# Patient Record
Sex: Female | Born: 1960 | Hispanic: No | Marital: Married | Smoking: Never smoker
Health system: Southern US, Community
[De-identification: ages and names within clinical notes are randomized; demographics above are authoritative.]

## PROBLEM LIST (undated history)

## (undated) DIAGNOSIS — J45909 Unspecified asthma, uncomplicated: Secondary | ICD-10-CM

## (undated) DIAGNOSIS — D219 Benign neoplasm of connective and other soft tissue, unspecified: Secondary | ICD-10-CM

## (undated) DIAGNOSIS — K219 Gastro-esophageal reflux disease without esophagitis: Secondary | ICD-10-CM

## (undated) DIAGNOSIS — M1990 Unspecified osteoarthritis, unspecified site: Secondary | ICD-10-CM

## (undated) HISTORY — PX: MYOMECTOMY: SHX85

---

## 2016-06-21 ENCOUNTER — Encounter (HOSPITAL_COMMUNITY): Payer: Self-pay | Admitting: *Deleted

## 2016-06-21 ENCOUNTER — Inpatient Hospital Stay (HOSPITAL_COMMUNITY)
Admission: AD | Admit: 2016-06-21 | Discharge: 2016-06-21 | Disposition: A | Discharge status: Home / Self Care | Payer: Self-pay | Source: Ambulatory Visit | Attending: Obstetrics & Gynecology | Admitting: Obstetrics & Gynecology

## 2016-06-21 ENCOUNTER — Inpatient Hospital Stay (HOSPITAL_COMMUNITY): Payer: Self-pay

## 2016-06-21 DIAGNOSIS — Z3202 Encounter for pregnancy test, result negative: Secondary | ICD-10-CM | POA: Insufficient documentation

## 2016-06-21 DIAGNOSIS — R102 Pelvic and perineal pain: Secondary | ICD-10-CM | POA: Insufficient documentation

## 2016-06-21 DIAGNOSIS — N83202 Unspecified ovarian cyst, left side: Secondary | ICD-10-CM | POA: Insufficient documentation

## 2016-06-21 DIAGNOSIS — D259 Leiomyoma of uterus, unspecified: Secondary | ICD-10-CM | POA: Insufficient documentation

## 2016-06-21 HISTORY — DX: Benign neoplasm of connective and other soft tissue, unspecified: D21.9

## 2016-06-21 HISTORY — DX: Unspecified asthma, uncomplicated: J45.909

## 2016-06-21 LAB — URINALYSIS, ROUTINE W REFLEX MICROSCOPIC
Bilirubin Urine: NEGATIVE
GLUCOSE, UA: NEGATIVE mg/dL
HGB URINE DIPSTICK: NEGATIVE
KETONES UR: NEGATIVE mg/dL
Leukocytes, UA: NEGATIVE
Nitrite: NEGATIVE
PROTEIN: NEGATIVE mg/dL
Specific Gravity, Urine: 1.008 (ref 1.005–1.030)
pH: 6 (ref 5.0–8.0)

## 2016-06-21 LAB — CBC
HCT: 33.8 % — ABNORMAL LOW (ref 36.0–46.0)
HEMOGLOBIN: 11.2 g/dL — AB (ref 12.0–15.0)
MCH: 26 pg (ref 26.0–34.0)
MCHC: 33.1 g/dL (ref 30.0–36.0)
MCV: 78.6 fL (ref 78.0–100.0)
PLATELETS: 371 10*3/uL (ref 150–400)
RBC: 4.3 MIL/uL (ref 3.87–5.11)
RDW: 20.1 % — ABNORMAL HIGH (ref 11.5–15.5)
WBC: 8.8 10*3/uL (ref 4.0–10.5)

## 2016-06-21 LAB — WET PREP, GENITAL
Clue Cells Wet Prep HPF POC: NONE SEEN
SPERM: NONE SEEN
Trich, Wet Prep: NONE SEEN
YEAST WET PREP: NONE SEEN

## 2016-06-21 LAB — POCT PREGNANCY, URINE: Preg Test, Ur: NEGATIVE

## 2016-06-21 MED ORDER — OXYCODONE HCL 5 MG PO TABS
5.0000 mg | ORAL_TABLET | Freq: Once | ORAL | Status: AC
  Administered 2016-06-21: 5 mg via ORAL
  Filled 2016-06-21: qty 1

## 2016-06-21 MED ORDER — TRAMADOL HCL 50 MG PO TABS: 50.0000 mg | ORAL_TABLET | Freq: Four times a day (QID) | ORAL | 0 refills | Status: AC | PRN

## 2016-06-21 NOTE — MAU Provider Note (Signed)
History     CSN: [id number]  Arrival date and time: 06/21/16 1135   First Provider Initiated Contact with Patient 06/21/16 1213      Chief Complaint  Patient presents with  . Abdominal Pain   56 y.o. non-pregnant female here with LAP. Pain started 4 days ago. She describes as intermittent and worse on right side. Rates pain 8/10. Has not used anything for the pain. Denies fever or chills. No N/V/D/C. She is new to the country, arrived just 4 days ago from [place]. She reports hx of uterine fibroids, previous myomectomy, and anemia. Regular monthly cycles that are heavy.    Past Medical History:  Diagnosis Date  . Asthma   . Fibroid     Past Surgical History:  Procedure Laterality Date  . CESAREAN SECTION      History reviewed. No pertinent family history.  Social History  Substance Use Topics  . Smoking status: Never Smoker  . Smokeless tobacco: Never Used  . Alcohol use No    Allergies:  Allergies  Allergen Reactions  . Ibuprofen     Patient states triggers ashma    No prescriptions prior to admission.    Review of Systems  Gastrointestinal: Positive for abdominal pain.  Genitourinary: Positive for pelvic pain. Negative for dysuria, frequency, hematuria, urgency, vaginal bleeding and vaginal discharge.   Physical Exam   Blood pressure 158/88, pulse 80, temperature 98.2 F (36.8 C), temperature source Oral, resp. rate 18, last menstrual period 05/31/2016, SpO2 100 %.  Physical Exam  Constitutional: She is oriented to person, place, and time. She appears well-developed and well-nourished. No distress.  HENT:  Head: Normocephalic and atraumatic.  Neck: Normal range of motion.  Cardiovascular: Normal rate.   Respiratory: Effort normal.  GI: Soft. She exhibits mass (below umbilicus, ?fibroids). She exhibits no distension. There is tenderness in the right lower quadrant and left lower quadrant. There is no rebound and no guarding.  Genitourinary:   Genitourinary Comments: External: no lesions or erythema Vagina: rugated, parous, scant white thin discharge Uterus: ++ enlarged, anteverted, + tender, no CMT Adnexae: no masses, no tenderness left, + tenderness right   Musculoskeletal: Normal range of motion.  Neurological: She is alert and oriented to person, place, and time.  Skin: Skin is warm and dry.  Psychiatric: She has a normal mood and affect.   Results for orders placed or performed during the hospital encounter of 06/21/16 (from the past 24 hour(s))  Urinalysis, Routine w reflex microscopic     Status: Abnormal   Collection Time: 06/21/16 11:49 AM  Result Value Ref Range   Color, Urine STRAW (A) YELLOW   APPearance CLEAR CLEAR   Specific Gravity, Urine 1.008 1.005 - 1.030   pH 6.0 5.0 - 8.0   Glucose, UA NEGATIVE NEGATIVE mg/dL   Hgb urine dipstick NEGATIVE NEGATIVE   Bilirubin Urine NEGATIVE NEGATIVE   Ketones, ur NEGATIVE NEGATIVE mg/dL   Protein, ur NEGATIVE NEGATIVE mg/dL   Nitrite NEGATIVE NEGATIVE   Leukocytes, UA NEGATIVE NEGATIVE  Pregnancy, urine POC     Status: None   Collection Time: 06/21/16 12:18 PM  Result Value Ref Range   Preg Test, Ur NEGATIVE NEGATIVE  Wet prep, genital     Status: Abnormal   Collection Time: 06/21/16 12:40 PM  Result Value Ref Range   Yeast Wet Prep HPF POC NONE SEEN NONE SEEN   Trich, Wet Prep NONE SEEN NONE SEEN   Clue Cells Wet Prep HPF POC NONE SEEN  NONE SEEN   WBC, Wet Prep HPF POC FEW (A) NONE SEEN   Sperm NONE SEEN   CBC     Status: Abnormal   Collection Time: 06/21/16 12:49 PM  Result Value Ref Range   WBC 8.8 4.0 - 10.5 K/uL   RBC 4.30 3.87 - 5.11 MIL/uL   Hemoglobin 11.2 (L) 12.0 - 15.0 g/dL   HCT 33.8 (L) 36.0 - 46.0 %   MCV 78.6 78.0 - 100.0 fL   MCH 26.0 26.0 - 34.0 pg   MCHC 33.1 30.0 - 36.0 g/dL   RDW 20.1 (H) 11.5 - 15.5 %   Platelets 371 150 - 400 K/uL   US Transvaginal Non-ob  Result Date: 06/21/2016 CLINICAL DATA:  Pelvic pain, cramping EXAM:  TRANSABDOMINAL AND TRANSVAGINAL ULTRASOUND OF PELVIS DOPPLER ULTRASOUND OF OVARIES TECHNIQUE: Both transabdominal and transvaginal ultrasound examinations of the pelvis were performed. Transabdominal technique was performed for global imaging of the pelvis including uterus, ovaries, adnexal regions, and pelvic cul-de-sac. It was necessary to proceed with endovaginal exam following the transabdominal exam to visualize the endometrium and ovaries. Color and duplex Doppler ultrasound was utilized to evaluate blood flow to the ovaries. COMPARISON:  None. FINDINGS: Uterus Measurements: 12.4 x 1.6 x 9.3 cm. Multiple hypoechoic uterine masses. Three anterior masses measuring 1.5 x 1.2 x 1.1 cm (submucosal), 3.6 x 3.9 x 4.2 cm (submucosal) and 2.5 x 4 x 3.5 cm (subserosal) respectively. Posterior uterine mass in the lower uterine segment measuring 3.5 x 3.2 x 2.7 cm (submucosal). Masses are most consistent with multiple fibroids. Endometrium Thickness: Tubercle to fully visualize. No focal abnormality visualized. Right ovary Not visualized. Left ovary Measurements: 4.1 x 3.6 x 3.3 cm. 3 x 2.1 x 2.2 cm hypoechoic, avascular left ovarian mass most consistent with a complex cyst. Pulsed Doppler evaluation of the left ovary demonstrates normal low-resistance arterial and venous waveforms. Other findings No abnormal free fluid. IMPRESSION: 1. Fibroid uterus. 2. Complex left ovarian cyst which may reflect a hemorrhagic cyst or endometrioma. 3. No left ovarian torsion. 4. Nonvisualized right ovary. Electronically Signed   By: Kathreen Devoid   On: 06/21/2016 13:55   US Pelvis Complete  Result Date: 06/21/2016 CLINICAL DATA:  Pelvic pain, cramping EXAM: TRANSABDOMINAL AND TRANSVAGINAL ULTRASOUND OF PELVIS DOPPLER ULTRASOUND OF OVARIES TECHNIQUE: Both transabdominal and transvaginal ultrasound examinations of the pelvis were performed. Transabdominal technique was performed for global imaging of the pelvis including uterus,  ovaries, adnexal regions, and pelvic cul-de-sac. It was necessary to proceed with endovaginal exam following the transabdominal exam to visualize the endometrium and ovaries. Color and duplex Doppler ultrasound was utilized to evaluate blood flow to the ovaries. COMPARISON:  None. FINDINGS: Uterus Measurements: 12.4 x 1.6 x 9.3 cm. Multiple hypoechoic uterine masses. Three anterior masses measuring 1.5 x 1.2 x 1.1 cm (submucosal), 3.6 x 3.9 x 4.2 cm (submucosal) and 2.5 x 4 x 3.5 cm (subserosal) respectively. Posterior uterine mass in the lower uterine segment measuring 3.5 x 3.2 x 2.7 cm (submucosal). Masses are most consistent with multiple fibroids. Endometrium Thickness: Tubercle to fully visualize. No focal abnormality visualized. Right ovary Not visualized. Left ovary Measurements: 4.1 x 3.6 x 3.3 cm. 3 x 2.1 x 2.2 cm hypoechoic, avascular left ovarian mass most consistent with a complex cyst. Pulsed Doppler evaluation of the left ovary demonstrates normal low-resistance arterial and venous waveforms. Other findings No abnormal free fluid. IMPRESSION: 1. Fibroid uterus. 2. Complex left ovarian cyst which may reflect a hemorrhagic cyst or endometrioma.  3. No left ovarian torsion. 4. Nonvisualized right ovary. Electronically Signed   By: Kathreen Devoid   On: 06/21/2016 13:55   [place] Art/ven Flow Abd Pelv Doppler  Result Date: 06/21/2016 CLINICAL DATA:  Pelvic pain, cramping EXAM: TRANSABDOMINAL AND TRANSVAGINAL ULTRASOUND OF PELVIS DOPPLER ULTRASOUND OF OVARIES TECHNIQUE: Both transabdominal and transvaginal ultrasound examinations of the pelvis were performed. Transabdominal technique was performed for global imaging of the pelvis including uterus, ovaries, adnexal regions, and pelvic cul-de-sac. It was necessary to proceed with endovaginal exam following the transabdominal exam to visualize the endometrium and ovaries. Color and duplex Doppler ultrasound was utilized to evaluate blood flow to the ovaries.  COMPARISON:  None. FINDINGS: Uterus Measurements: 12.4 x 1.6 x 9.3 cm. Multiple hypoechoic uterine masses. Three anterior masses measuring 1.5 x 1.2 x 1.1 cm (submucosal), 3.6 x 3.9 x 4.2 cm (submucosal) and 2.5 x 4 x 3.5 cm (subserosal) respectively. Posterior uterine mass in the lower uterine segment measuring 3.5 x 3.2 x 2.7 cm (submucosal). Masses are most consistent with multiple fibroids. Endometrium Thickness: Tubercle to fully visualize. No focal abnormality visualized. Right ovary Not visualized. Left ovary Measurements: 4.1 x 3.6 x 3.3 cm. 3 x 2.1 x 2.2 cm hypoechoic, avascular left ovarian mass most consistent with a complex cyst. Pulsed Doppler evaluation of the left ovary demonstrates normal low-resistance arterial and venous waveforms. Other findings No abnormal free fluid. IMPRESSION: 1. Fibroid uterus. 2. Complex left ovarian cyst which may reflect a hemorrhagic cyst or endometrioma. 3. No left ovarian torsion. 4. Nonvisualized right ovary. Electronically Signed   By: Kathreen Devoid   On: 06/21/2016 13:55   MAU Course  Procedures Oxycodone 5 mg po  MDM Labs and [place] ordered and reviewed. Some pain relief after meds. No evidence of acute abdominal process. Pain likely r/t uterine fibroids and cyst. Stable for discharge home.  Assessment and Plan   1. Uterine leiomyoma, unspecified location   2. Pelvic pain   3. Left ovarian cyst    Discharge home Follow up in [place] next available appt Rx Ultram  Allergies as of 06/21/2016      Reactions   Ibuprofen    Patient states triggers ashma      Medication List    TAKE these medications   traMADol 50 MG tablet Commonly known as:  ULTRAM Take 1 tablet (50 mg total) by mouth every 6 (six) hours as needed.      Julianne Handler, CNM 06/21/2016, 12:31 PM

## 2016-06-21 NOTE — MAU Note (Signed)
Patient presents to mau with c/o lower abdominal pain that started [place] on [place]. Cramping in nature. Denies vaginal bleeding.

## 2016-06-21 NOTE — Discharge Instructions (Signed)
Uterine Fibroids Uterine fibroids are tissue masses (tumors). They are also called leiomyomas. They can develop inside of a womans womb (uterus). They can grow very large. Fibroids are not cancerous (benign). Most fibroids do not require medical treatment. Follow these instructions at home:  Keep all follow-up visits as told by your doctor. This is important.  Take medicines only as told by your doctor.  If you were prescribed a hormone treatment, take the hormone medicines exactly as told.  Do not take aspirin. It can cause bleeding.  Ask your doctor about taking iron pills and increasing the amount of dark green, leafy vegetables in your diet. These actions can help to boost your blood iron levels.  Pay close attention to your period. Tell your doctor about any changes, such as:  Increased blood flow. This may require you to use more pads or tampons than usual per month.  A change in the number of days that your period lasts per month.  A change in symptoms that come with your period, such as back pain or cramping in your belly area (abdomen). Contact a doctor if:  You have pain in your back or the area between your hip bones (pelvic area) that is not controlled by medicines.  You have pain in your abdomen that is not controlled with medicines.  You have an increase in bleeding between and during periods.  You soak tampons or pads in a half hour or less.  You feel lightheaded.  You feel extra tired.  You feel weak. Get help right away if:  You pass out (faint).  You have a sudden increase in pelvic pain. This information is not intended to replace advice given to you by your health care provider. Make sure you discuss any questions you have with your health care provider. Document Released: 05/02/2010 Document Revised: 11/29/2015 Document Reviewed: 09/26/2013 Elsevier Interactive Patient Education  2017 [employer].

## 2016-06-22 LAB — GC/CHLAMYDIA PROBE AMP (~~LOC~~) NOT AT ARMC
CHLAMYDIA, DNA PROBE: NEGATIVE
Neisseria Gonorrhea: NEGATIVE

## 2016-06-24 ENCOUNTER — Other Ambulatory Visit: Payer: Self-pay | Admitting: Obstetrics and Gynecology

## 2016-06-24 ENCOUNTER — Other Ambulatory Visit (HOSPITAL_COMMUNITY)
Admission: RE | Admit: 2016-06-24 | Discharge: 2016-06-24 | Disposition: A | Discharge status: Home / Self Care | Payer: Self-pay | Source: Ambulatory Visit | Attending: Obstetrics and Gynecology | Admitting: Obstetrics and Gynecology

## 2016-06-24 DIAGNOSIS — Z1151 Encounter for screening for human papillomavirus (HPV): Secondary | ICD-10-CM | POA: Insufficient documentation

## 2016-06-24 DIAGNOSIS — Z01419 Encounter for gynecological examination (general) (routine) without abnormal findings: Secondary | ICD-10-CM | POA: Insufficient documentation

## 2016-06-25 LAB — CYTOLOGY - PAP
Adequacy: ABSENT
Diagnosis: NEGATIVE
HPV: NOT DETECTED

## 2016-06-29 ENCOUNTER — Ambulatory Visit (INDEPENDENT_AMBULATORY_CARE_PROVIDER_SITE_OTHER): Payer: Self-pay | Admitting: Family Medicine

## 2016-06-29 VITALS — BP 128/84 | HR 79 | Temp 99.0°F | Resp 16 | Ht 62.0 in | Wt 240.8 lb

## 2016-06-29 DIAGNOSIS — G9332 Myalgic encephalomyelitis/chronic fatigue syndrome: Secondary | ICD-10-CM

## 2016-06-29 DIAGNOSIS — J4540 Moderate persistent asthma, uncomplicated: Secondary | ICD-10-CM

## 2016-06-29 DIAGNOSIS — M170 Bilateral primary osteoarthritis of knee: Secondary | ICD-10-CM

## 2016-06-29 DIAGNOSIS — D250 Submucous leiomyoma of uterus: Secondary | ICD-10-CM

## 2016-06-29 DIAGNOSIS — R5382 Chronic fatigue, unspecified: Secondary | ICD-10-CM

## 2016-06-29 NOTE — Patient Instructions (Addendum)
We recommend that you schedule a mammogram for breast cancer screening. Typically, you do not need a referral to do this. Please contact a local imaging center to schedule your mammogram.  [facility] - [phone]  *ask for the Radiology Department [facility] ([place]) - [phone] or [phone]  [facility] - [phone] [place] [phone] [facility] - [phone]  *ask for the [facility] - [phone]  *ask for the Radiology Department [facility] - [phone]  *ask for the [place] - [phone]     IF you received an x-ray today, you will receive an invoice from [facility] Rad[facility]. Please contact [facility] at [phone] with questions or concerns regarding your invoice.   IF you received labwork today, you will receive an invoice from [facility]. Please contact [facility] at [phone] with questions or concerns regarding your invoice.   Our billing staff will not be able to assist you with questions regarding bills from these companies.  You will be contacted with the lab results as soon as they are available. The fastest way to get your results is to activate your My Chart account. Instructions are located on the last page of this paperwork. If you have not heard from [place] regarding the results in 2 weeks, please contact this office.      Arthritis Arthritis means joint pain. It can also mean joint disease. A joint is a place where bones come together. People who have arthritis may have:  Red joints.  Swollen joints.  Stiff joints.  Warm joints.  A fever.  A feeling of being sick. Follow these instructions at home: Pay attention to any changes in your symptoms. Take these actions to help with your pain and swelling. Medicines   Take over-the-counter and prescription medicines only as  told by your doctor.  Do not take aspirin for pain if your doctor says that you may have gout. Activity   Rest your joint if your doctor tells you to.  Avoid activities that make the pain worse.  Exercise your joint regularly as told by your doctor. Try doing exercises like:  Swimming.  Water aerobics.  Biking.  Walking. Joint Care    If your joint is swollen, keep it raised (elevated) if told by your doctor.  If your joint feels stiff in the morning, try taking a warm shower.  If you have diabetes, do not apply heat without asking your doctor.  If told, apply heat to the joint:  Put a towel between the joint and the hot pack or heating pad.  Leave the heat on the area for 20-30 minutes.  If told, apply ice to the joint:  Put ice in a plastic bag.  Place a towel between your skin and the bag.  Leave the ice on for 20 minutes, 2-3 times per day.  Keep all follow-up visits as told by your doctor. Contact a doctor if:  The pain gets worse.  You have a fever. Get help right away if:  You have very bad pain in your joint.  You have swelling in your joint.  Your joint is red.  Many joints become painful and swollen.  You have very bad back pain.  Your leg is very weak.  You cannot control your pee (urine) or poop (stool). This information is not intended to replace advice given to you by your health care provider.  Make sure you discuss any questions you have with your health care provider. Document Released: 06/24/2009 Document Revised: 09/05/2015 Document Reviewed: 06/25/2014 Elsevier Interactive Patient Education  2017 [employer].

## 2016-06-29 NOTE — Progress Notes (Signed)
Chief Complaint  Patient presents with  . Asthma    takes clestene it makes her itching     HPI ID # [id number] Patient is visiting the from [place] with her sister.   She is here for asthma exacerbation, knee pain and fatigue.   Pt reports that she has been diagnosed with asthma for 7 years She reports that this medication is making her feel more fatigue She reports that her asthma has been She reports waking up at night choking In the daytime she is using her ventolin   Last asthma exacerbation a few weeks ago.  She reports that she has been having more fatigue. She has a history of severe anemia and will be getting a hysterectomy due to her recent diagnosis  She is on a french version of beclomethasone and gets itchy hands and feet She is also on albuterol.  Fatigue and Chronic Uterine Fibroid She also has uterine fibroids and is scheduled for surgery on July 08, 2016 She reports that she did not know that when she visited this would be a problem.  She has a history of fatigue and anemia for years.  Bilateral Knee Pain Patient reports that she has been having long history of lower extremity pain and edema specifically in her knees bilaterally with some crunchy feeling and swelling. She denies calf pain and denies   Past Medical History:  Diagnosis Date  . Arthritis    both knees  . Asthma   . Fibroid   . GERD (gastroesophageal reflux disease)     Current Outpatient Prescriptions  Medication Sig Dispense Refill  . albuterol (PROVENTIL HFA;VENTOLIN HFA) 108 (90 Base) MCG/ACT inhaler Inhale 1-2 puffs into the lungs every 6 (six) hours as needed for wheezing or shortness of breath.    . traMADol (ULTRAM) 50 MG tablet Take 1 tablet (50 mg total) by mouth every 6 (six) hours as needed. (Patient taking differently: Take 50 mg by mouth every 6 (six) hours as needed (for pain.). ) 30 tablet 0  . acetaminophen (TYLENOL 8 HOUR ARTHRITIS PAIN) 650 MG CR tablet Take 650 mg by  mouth every 8 (eight) hours as needed for pain.    Marland Kitchen BETAMETHASONE PO Take 2 mg by mouth every evening. Celestene 2mg     . ferrous sulfate 325 (65 FE) MG tablet Take 325 mg by mouth 2 (two) times daily.    . traMADol (ULTRAM) 50 MG tablet Take 1 tablet (50 mg total) by mouth every 6 (six) hours as needed for moderate pain. 30 tablet 0   No current facility-administered medications for this visit.     Allergies:  Allergies  Allergen Reactions  . Ibuprofen Other (See Comments)    Pt states that this medication triggers her asthma.    . Shrimp [Shellfish Allergy] Swelling    Of tongue and itching    Past Surgical History:  Procedure Laterality Date  . CESAREAN SECTION    . HYSTERECTOMY ABDOMINAL WITH SALPINGECTOMY Bilateral 07/08/2016   Procedure: HYSTERECTOMY ABDOMINAL WITH SALPINGECTOMY;  Surgeon: Thurnell Lose, MD;  Location: [facility];  Service: Gynecology;  Laterality: Bilateral;  . LYSIS OF ADHESION N/A 07/08/2016   Procedure: LYSIS OF ADHESION;  Surgeon: Thurnell Lose, MD;  Location: [facility];  Service: Gynecology;  Laterality: N/A;  . MYOMECTOMY      Social History   Social History  . Marital status: Married    Spouse name: N/A  . Number of children: N/A  . Years of education:  N/A   Social History Main Topics  . Smoking status: Never Smoker  . Smokeless tobacco: Never Used  . Alcohol use No  . Drug use: No  . Sexual activity: Not Asked   Other Topics Concern  . None   Social History Narrative  . None    Review of Systems  Constitutional: Positive for malaise/fatigue. Negative for chills, diaphoresis and fever.  HENT: Negative for congestion, ear discharge and nosebleeds.   Eyes: Negative for blurred vision and double vision.  Respiratory: Positive for cough, shortness of breath and wheezing.   Cardiovascular: Negative for chest pain, palpitations and leg swelling.  Gastrointestinal: Negative for blood in stool, melena, nausea and vomiting.  Musculoskeletal:  Positive for joint pain. Negative for back pain, falls, myalgias and neck pain.  Skin: Negative for itching and rash.  Neurological: Negative for dizziness, tingling, tremors and headaches.  Psychiatric/Behavioral: Negative for depression. The patient is not nervous/anxious and does not have insomnia.    See hpi  Objective: Vitals:   06/29/16 1137  BP: 128/84  Pulse: 79  Resp: 16  Temp: 99 F (37.2 C)  TempSrc: Oral  SpO2: 97%  Weight: 240 lb 12.8 oz (109.2 kg)  Height: 5\' 2"  (1.575 m)    Physical Exam  Constitutional: She is oriented to person, place, and time. She appears well-developed and well-nourished.  HENT:  Head: Normocephalic and atraumatic.  Eyes: Conjunctivae and EOM are normal.  Neck: Normal range of motion. No thyromegaly present.  Cardiovascular: Normal rate, regular rhythm, normal heart sounds and intact distal pulses.   No murmur heard. Pulmonary/Chest: Effort normal. No respiratory distress. She has wheezes. She has no rales. She exhibits no tenderness.  Musculoskeletal:       Right knee: She exhibits swelling. She exhibits normal range of motion, no effusion, no ecchymosis, no deformity, no laceration, no erythema, normal alignment, no LCL laxity, no bony tenderness, normal meniscus and no MCL laxity.       Left knee: She exhibits swelling. She exhibits normal range of motion, no effusion, no ecchymosis, no deformity, no laceration, no erythema, normal alignment, no bony tenderness and normal meniscus.  Bilateral crepitus  Neurological: She is alert and oriented to person, place, and time. No cranial nerve deficit. Coordination normal.    Assessment and Plan Abigail Gibson was seen today for asthma.  Diagnoses and all orders for this visit:  Moderate persistent asthma without complication-  Patient does not want to change medications at this time She does not have insurance and plans to return to her provider in [place] Discussed reasons for urgent and  emergent follow up Patient assumes this risks and declines management changes today Discussed that I would give her a steroid burst high dose if she has progressive symptoms  Primary osteoarthritis of both knees- currently taking NSAIDs She was also advised to use topical meds like Aspercreme with lidocaine  Submucous uterine fibroid Fatigue - continue with Gynecology as discussed  Discussed postop management and that her fatigue might not resolved Discussed ways to improve iron intake in her diet  A total of 30 minutes were spent face-to-face with the patient during this encounter and over half of that time was spent on counseling and coordination of care.    [place]

## 2016-07-01 NOTE — Patient Instructions (Signed)
Your procedure is scheduled on:  Wednesday, July 08, 2016  Enter through the [employer] [facility] at:  7:15 AM  Pick up the phone at the desk and dial [phone].  Call this number if you have problems the morning of surgery: [phone].  Remember: Do NOT eat food or drink after:  Midnight Tuesday  Take these medicines the morning of surgery with a SIP OF WATER:  None  Bring Asthma Inhaler day of surgery  Stop ALL herbal medications at this time  Do NOT smoke the day of surgery.  Do NOT wear jewelry (body piercing), metal hair clips/bobby pins, make-up, or nail polish. Do NOT wear lotions, powders, or perfumes.  You may wear deodorant. Do NOT shave for 48 hours prior to surgery. Do NOT bring valuables to the hospital. Contacts, dentures, or bridgework may not be worn into surgery.  Leave suitcase in car.  After surgery it may be brought to your room.  For patients admitted to the hospital, checkout time is 11:00 AM the day of discharge.  Bring a copy of your healthcare power of attorney and living will documents.

## 2016-07-03 ENCOUNTER — Encounter (HOSPITAL_COMMUNITY): Payer: Self-pay

## 2016-07-03 ENCOUNTER — Encounter (HOSPITAL_COMMUNITY)
Admission: RE | Admit: 2016-07-03 | Discharge: 2016-07-03 | Disposition: A | Discharge status: Home / Self Care | Payer: Self-pay | Source: Ambulatory Visit | Attending: Obstetrics and Gynecology | Admitting: Obstetrics and Gynecology

## 2016-07-03 DIAGNOSIS — Z01818 Encounter for other preprocedural examination: Secondary | ICD-10-CM | POA: Insufficient documentation

## 2016-07-03 HISTORY — DX: Gastro-esophageal reflux disease without esophagitis: K21.9

## 2016-07-03 HISTORY — DX: Unspecified osteoarthritis, unspecified site: M19.90

## 2016-07-03 LAB — CBC
HCT: 34 % — ABNORMAL LOW (ref 36.0–46.0)
Hemoglobin: 10.9 g/dL — ABNORMAL LOW (ref 12.0–15.0)
MCH: 25.1 pg — ABNORMAL LOW (ref 26.0–34.0)
MCHC: 32.1 g/dL (ref 30.0–36.0)
MCV: 78.2 fL (ref 78.0–100.0)
PLATELETS: 552 10*3/uL — AB (ref 150–400)
RBC: 4.35 MIL/uL (ref 3.87–5.11)
RDW: 18.8 % — ABNORMAL HIGH (ref 11.5–15.5)
WBC: 5.9 10*3/uL (ref 4.0–10.5)

## 2016-07-03 LAB — TYPE AND SCREEN
ABO/RH(D): O POS
ANTIBODY SCREEN: NEGATIVE

## 2016-07-03 LAB — ABO/RH: ABO/RH(D): O POS

## 2016-07-03 NOTE — Pre-Procedure Instructions (Signed)
Used Associate Professor ID # [id number]

## 2016-07-06 MED ORDER — LIDOCAINE HCL 1 % IJ SOLN
INTRAMUSCULAR | Status: AC
  Filled 2016-07-06: qty 20

## 2016-07-06 MED ORDER — SODIUM BICARBONATE 8.4 % IV SOLN
INTRAVENOUS | Status: AC
  Filled 2016-07-06: qty 50

## 2016-07-06 MED ORDER — LIDOCAINE-EPINEPHRINE (PF) 2 %-1:200000 IJ SOLN
INTRAMUSCULAR | Status: AC
  Filled 2016-07-06: qty 20

## 2016-07-07 ENCOUNTER — Encounter: Payer: Self-pay | Admitting: Family Medicine

## 2016-07-07 NOTE — H&P (Signed)
Chief Complaint(s):   New/abnormal pap from [place]/     records are in french for abnormal pap, ER visit recently for pain, u/s done and shows fibroids and ovarian cyst   HPI:  General Pt was seen in [place] shortly after arriving in [place] to visit her brother. Sudden onset of pain. Pt has heavy menses that lasts 7 days. 2 days of very heavy. Changes tampons/pads every hour. After that they lighten up. Pt feels cold. Denies fatigue. Bled twice in 1 month 3 months ago. Pt was diagnosed in [place], [place], 3 years ago. Was told to wait menopause. Pt presents for an annual gyn exam.  Current Medication:  Taking  Tramadol HCl 50 MG Tablet 1 tablet as needed Orally every 6 hrs     Medication List reviewed and reconciled with the patient   Medical History:   No Medical History.      Allergies/Intolerance:   Ibuprofen - triggers asmtha   Gyn History:   Sexual activity currently sexually active.  Periods : every month.  LMP 05/25/16, heavy bleeding.  Denies Birth control.  Last mammogram date 3 years ago.   OB History:   Number of pregnancies 3.  Pregnancy # 1 live birth, vaginal delivery.  Pregnancy # 2 live birth, vaginal delivery.  Pregnancy # 3 live birth, C-section.   Surgical History:   Fibroids removed 2002     C-section 2004   Hospitalization:   see surgery   Family History:   Father: deceased, diagnosed with Hypertension    Mother: alive, asmtha    Brother 1: deceased, diagnosed with Colon cancer   denies any GYN family cancer hx.  Social History:  General Tobacco use  cigarettes: Never smoked  Tobacco history last updated 06/24/2016  no Alcohol.  no Recreational drug use.  Marital Status: married.  Children: Boys, 1, girls, 2.  ROS: CONSTITUTIONAL none" options="no,yes" propid="91" itemid="[id number]" categoryid="10464" encounterid="[id number]"Fatigue none. none today" options="no,yes" propid="91" itemid="10467" categoryid="10464"  encounterid="[id number]"Fever none today.  CARDIOLOGY none" options="no,yes" propid="91" itemid="193603" categoryid="10488" encounterid="[id number]"Chest pain none. No h/o mitral valve prolapse" options="no,yes" propid="91" itemid="194827" categoryid="10488" encounterid="[id number]"Murmurs No h/o mitral valve prolapse.  RESPIRATORY no" options="no" propid="91" itemid="270013" categoryid="138132" encounterid="[id number]"Shortness of breath no. no" options="no,yes" propid="91" itemid="172745" categoryid="138132" encounterid="[id number]"Cough no.  GASTROENTEROLOGY none" options="no,yes" propid="91" itemid="10496" categoryid="10494" encounterid="[id number]"Abdominal pain none. no" options="no,yes" propid="91" itemid="193449" categoryid="10494" encounterid="[id number]"Change in bowel habits no. No" options="no,yes" propid="91" itemid="10501" categoryid="10494" encounterid="[id number]"Constipation No. No gallbladder problems" options="" propid="91" itemid="282558" categoryid="10494" encounterid="[id number]"Gallstones No gallbladder problems. No h/o liver problems" options="" propid="91" itemid="282559" categoryid="10494" encounterid="[id number]"Hepatitis/yellow jaundice No h/o liver problems.  FEMALE REPRODUCTIVE no" options="no,yes" propid="91" itemid="196298" categoryid="10525" encounterid="[id number]"Breast lumps or discharge no. none" options="no,yes" propid="91" itemid="186083" categoryid="10525" encounterid="[id number]"Breast pain none. no" options="no,yes" propid="91" itemid="138235" categoryid="10525" encounterid="[id number]"Dysmenorrhea no. none" options="no,yes" propid="91" itemid="138198" categoryid="10525" encounterid="[id number]"Dyspareunia none. no" options="no,yes" propid="91" itemid="202654" categoryid="10525" encounterid="[id number]"Dysuria no. no " options="no,yes" propid="91" itemid="193444" categoryid="10525" encounterid="[id number]"Irregular menses no . none" options="no,yes" propid="91" itemid="186082" categoryid="10525"  encounterid="[id number]"Pelvic pain none. no" options="no,yes" propid="91" itemid="278230" categoryid="10525" encounterid="[id number]"Unusual vaginal discharge no. no" options="no,yes" propid="91" itemid="278942" categoryid="10525" encounterid="[id number]"Vaginal itching no.  NEUROLOGY none" options="no,yes" propid="91" itemid="193627" categoryid="12512" encounterid="[id number]"Migraines none. No" options="no,yes" propid="91" itemid="12515" categoryid="12512" encounterid="[id number]"Seizures No. none" options="no,yes" propid="91" itemid="12514" categoryid="12512" encounterid="[id number]"Tingling/numbness none.  PSYCHOLOGY no" options="" propid="91" itemid="275919" categoryid="10520" encounterid="[id number]"Depression no.  SKIN no" options="no,yes" propid="91" itemid="269383" categoryid="202750" encounterid="[id number]"Rash no. no" options="no,yes" propid="91" itemid="202757" categoryid="202750" encounterid="[id number]"Suspicious lesions no.  ENDOCRINOLOGY none" options="no,yes" propid="91" itemid="202624" categoryid="12508" encounterid="[id number]"Hot flashes none. none" options="no,yes" propid="91" itemid="193436" categoryid="12508" encounterid="[id number]"Weight gain none. none" options="no,yes" propid="91" itemid="138164" categoryid="12508" encounterid="[id number]"Weight loss none.  HEMATOLOGY/LYMPH no" options="no,yes" propid="91" itemid="193454" categoryid="138157" encounterid="[id number]"Anemia no. No  h/o blood clots" options="" propid="91" itemid="288987" categoryid="138157" encounterid="[id number]"Blood Clots No h/o blood clots.  DERMATOLOGY none" options="no,yes" propid="91" itemid="186079" categoryid="12503" encounterid="[id number]"Acne none.    Objective: Vitals:  Wt 242, Ht 62.5, BMI 43.55, Pulse sitting 88, BP sitting 136/88  Past Results:  Examination:  Physical Examination: GENERAL in NAD, pleasant"Patient appears in NAD, pleasant.  well developed"Build: well developed.  well-appearing"General Appearance: well-appearing.    African"Race: African.  NECK normal"ROM: normal.  no thyromegaly, non tender"Thyroid: no thyromegaly, non tender.  BREASTS no palpable masses, no lymphadenopathy, nontender"Axilla: no palpable masses, no lymphadenopathy, nontender.  no masses, dimpling, retraction, or nipple discharge , bilaterally"Breast Mass: no masses, dimpling, retraction, or nipple discharge , bilaterally.  unremarkable"Skin Change: unremarkable.  ABDOMEN no masses,tenderness,fibroid uterus palpated up to umbilicus, , obese"General: no masses,tenderness,fibroid uterus palpated up to umbilicus, , obese.  FEMALE GENITOURINARY no mass, non tender"Adnexa: no mass, non tender.  normal, no lesions"Anus/perineum: normal, no lesions.  normal appearance , no lesions/discharge/bleeding, good pelvic support "Cervix/ cuff: normal appearance , no lesions/discharge/bleeding, good pelvic support .  normal, no lesions, no skin discoloration"External genitalia: normal, no lesions, no skin discoloration.  deferred"Rectum: deferred.  normal external meatus"Urethra: normal external meatus.  irregularly enlarged, freely mobile, 20 weeks"Uterus: irregularly enlarged, freely mobile, 20 weeks.  pink/moist mucosa, no lesions, no abnormal discharge, odorless"Vagina: pink/moist mucosa, no lesions, no abnormal discharge, odorless.  normal, no lesions, no skin discoloration"Vulva: normal, no lesions, no skin discoloration.  EXTREMITIES no clubbing cyanosis or edema present"Extremities no clubbing cyanosis or edema present.  NEUROLOGICAL grossly intact"gross motor and sensory grossly intact.  alert and oriented x 3"Orientation: alert and oriented x 3.    Assessment: Assessment:  Encounter for gynecological examination (general) (routine) with abnormal findings - Z01.411 (Primary)     Complex cyst of left ovary - T51.761     Uterine leiomyoma, unspecified location - D25.9     Plan: Treatment:  Uterine leiomyoma, unspecified location   Notes: Pelvic ultrasound. Pt desires hysterectomy before she goes back to [place]. Discussed different types of hysterectomies. Pt informed she could be referred to [facility] for robotic hyst but would not be in time for her to return home. Pt would like to proceed with TAH. Risk discussed.  Others  Referral YW:[facility]  Reason:Precert TAH/BS on 2/69 if possible. Pt needs pelvic ultrasound and CA-125 prior to surgery.

## 2016-07-08 ENCOUNTER — Encounter (HOSPITAL_COMMUNITY): Payer: Self-pay

## 2016-07-08 ENCOUNTER — Encounter (HOSPITAL_COMMUNITY)
Admission: RE | Disposition: A | Discharge status: Home / Self Care | Payer: Self-pay | Source: Ambulatory Visit | Attending: Obstetrics and Gynecology

## 2016-07-08 ENCOUNTER — Inpatient Hospital Stay (HOSPITAL_COMMUNITY): Payer: Self-pay | Admitting: Anesthesiology

## 2016-07-08 ENCOUNTER — Inpatient Hospital Stay (HOSPITAL_COMMUNITY)
Admission: RE | Admit: 2016-07-08 | Discharge: 2016-07-12 | DRG: 742 | Disposition: A | Discharge status: Home / Self Care | Payer: Self-pay | Source: Ambulatory Visit | Attending: Obstetrics and Gynecology | Admitting: Obstetrics and Gynecology

## 2016-07-08 DIAGNOSIS — N83292 Other ovarian cyst, left side: Secondary | ICD-10-CM | POA: Diagnosis present

## 2016-07-08 DIAGNOSIS — R5082 Postprocedural fever: Secondary | ICD-10-CM | POA: Diagnosis not present

## 2016-07-08 DIAGNOSIS — Z90710 Acquired absence of both cervix and uterus: Secondary | ICD-10-CM | POA: Diagnosis present

## 2016-07-08 DIAGNOSIS — N920 Excessive and frequent menstruation with regular cycle: Secondary | ICD-10-CM | POA: Diagnosis present

## 2016-07-08 DIAGNOSIS — Z6841 Body Mass Index (BMI) 40.0 and over, adult: Secondary | ICD-10-CM

## 2016-07-08 DIAGNOSIS — D259 Leiomyoma of uterus, unspecified: Principal | ICD-10-CM | POA: Diagnosis present

## 2016-07-08 DIAGNOSIS — Z720 Tobacco use: Secondary | ICD-10-CM

## 2016-07-08 HISTORY — PX: LYSIS OF ADHESION: SHX5961

## 2016-07-08 HISTORY — PX: HYSTERECTOMY ABDOMINAL WITH SALPINGECTOMY: SHX6725

## 2016-07-08 SURGERY — HYSTERECTOMY, TOTAL, ABDOMINAL, WITH SALPINGECTOMY
Anesthesia: General | Site: Abdomen

## 2016-07-08 MED ORDER — LACTATED RINGERS IV SOLN
INTRAVENOUS | Status: DC
  Administered 2016-07-08 – 2016-07-09 (×2): via INTRAVENOUS

## 2016-07-08 MED ORDER — FENTANYL CITRATE (PF) 100 MCG/2ML IJ SOLN
INTRAMUSCULAR | Status: AC
  Filled 2016-07-08: qty 2

## 2016-07-08 MED ORDER — ACETAMINOPHEN 10 MG/ML IV SOLN
1000.0000 mg | Freq: Four times a day (QID) | INTRAVENOUS | Status: DC
  Administered 2016-07-08: 1000 mg via INTRAVENOUS
  Filled 2016-07-08 (×4): qty 100

## 2016-07-08 MED ORDER — GLYCOPYRROLATE 0.2 MG/ML IJ SOLN
INTRAMUSCULAR | Status: AC
  Filled 2016-07-08: qty 1

## 2016-07-08 MED ORDER — MIDAZOLAM HCL 2 MG/2ML IJ SOLN
INTRAMUSCULAR | Status: DC | PRN
  Administered 2016-07-08: 2 mg via INTRAVENOUS

## 2016-07-08 MED ORDER — SUGAMMADEX SODIUM 200 MG/2ML IV SOLN
INTRAVENOUS | Status: DC | PRN
  Administered 2016-07-08: 200 mg via INTRAVENOUS

## 2016-07-08 MED ORDER — PROPOFOL 10 MG/ML IV BOLUS
INTRAVENOUS | Status: AC
  Filled 2016-07-08: qty 20

## 2016-07-08 MED ORDER — SODIUM CHLORIDE 0.9% FLUSH: 9.0000 mL | INTRAVENOUS | Status: DC | PRN

## 2016-07-08 MED ORDER — ACETAMINOPHEN 500 MG PO TABS
1000.0000 mg | ORAL_TABLET | Freq: Four times a day (QID) | ORAL | Status: DC | PRN
  Administered 2016-07-08 – 2016-07-10 (×4): 1000 mg via ORAL
  Filled 2016-07-08 (×4): qty 2

## 2016-07-08 MED ORDER — PROPOFOL 10 MG/ML IV BOLUS
INTRAVENOUS | Status: DC | PRN
  Administered 2016-07-08: 200 mg via INTRAVENOUS

## 2016-07-08 MED ORDER — EPHEDRINE SULFATE 50 MG/ML IJ SOLN
INTRAMUSCULAR | Status: DC | PRN
  Administered 2016-07-08: 10 mg via INTRAVENOUS
  Administered 2016-07-08: 5 mg via INTRAVENOUS

## 2016-07-08 MED ORDER — FENTANYL CITRATE (PF) 100 MCG/2ML IJ SOLN
INTRAMUSCULAR | Status: AC
Start: 2016-07-08 — End: 2016-07-09
  Filled 2016-07-08: qty 2

## 2016-07-08 MED ORDER — HYDROMORPHONE HCL 1 MG/ML IJ SOLN
INTRAMUSCULAR | Status: DC | PRN
  Administered 2016-07-08 (×2): 0.5 mg via INTRAVENOUS

## 2016-07-08 MED ORDER — TRAMADOL HCL 50 MG PO TABS
50.0000 mg | ORAL_TABLET | Freq: Four times a day (QID) | ORAL | Status: DC | PRN
  Administered 2016-07-09 – 2016-07-11 (×3): 50 mg via ORAL
  Filled 2016-07-08 (×3): qty 1

## 2016-07-08 MED ORDER — FENTANYL CITRATE (PF) 100 MCG/2ML IJ SOLN
25.0000 ug | INTRAMUSCULAR | Status: DC | PRN
  Administered 2016-07-08 (×3): 50 ug via INTRAVENOUS

## 2016-07-08 MED ORDER — LACTATED RINGERS IV SOLN: INTRAVENOUS | Status: DC

## 2016-07-08 MED ORDER — SCOPOLAMINE 1 MG/3DAYS TD PT72
MEDICATED_PATCH | TRANSDERMAL | Status: AC
  Administered 2016-07-08: 1.5 mg via TRANSDERMAL
  Filled 2016-07-08: qty 1

## 2016-07-08 MED ORDER — ONDANSETRON HCL 4 MG/2ML IJ SOLN
4.0000 mg | Freq: Four times a day (QID) | INTRAMUSCULAR | Status: DC | PRN
  Administered 2016-07-09: 4 mg via INTRAVENOUS
  Filled 2016-07-08: qty 2

## 2016-07-08 MED ORDER — FENTANYL CITRATE (PF) 100 MCG/2ML IJ SOLN
INTRAMUSCULAR | Status: DC | PRN
  Administered 2016-07-08 (×5): 50 ug via INTRAVENOUS

## 2016-07-08 MED ORDER — SIMETHICONE 80 MG PO CHEW
80.0000 mg | CHEWABLE_TABLET | Freq: Four times a day (QID) | ORAL | Status: DC | PRN
  Administered 2016-07-08 – 2016-07-10 (×3): 80 mg via ORAL
  Filled 2016-07-08 (×3): qty 1

## 2016-07-08 MED ORDER — SODIUM CHLORIDE 0.9 % IJ SOLN
INTRAMUSCULAR | Status: AC
  Filled 2016-07-08: qty 10

## 2016-07-08 MED ORDER — MIDAZOLAM HCL 2 MG/2ML IJ SOLN
INTRAMUSCULAR | Status: AC
  Filled 2016-07-08: qty 2

## 2016-07-08 MED ORDER — ROCURONIUM BROMIDE 100 MG/10ML IV SOLN
INTRAVENOUS | Status: DC | PRN
  Administered 2016-07-08: 10 mg via INTRAVENOUS
  Administered 2016-07-08: 40 mg via INTRAVENOUS
  Administered 2016-07-08 (×3): 10 mg via INTRAVENOUS

## 2016-07-08 MED ORDER — NALOXONE HCL 0.4 MG/ML IJ SOLN: 0.4000 mg | INTRAMUSCULAR | Status: DC | PRN

## 2016-07-08 MED ORDER — DEXAMETHASONE SODIUM PHOSPHATE 10 MG/ML IJ SOLN
INTRAMUSCULAR | Status: DC | PRN
Start: 2016-07-08 — End: 2016-07-08
  Administered 2016-07-08: 4 mg via INTRAVENOUS

## 2016-07-08 MED ORDER — CEFAZOLIN SODIUM-DEXTROSE 2-4 GM/100ML-% IV SOLN
2.0000 g | INTRAVENOUS | Status: AC
  Administered 2016-07-08: 2 g via INTRAVENOUS

## 2016-07-08 MED ORDER — DIPHENHYDRAMINE HCL 12.5 MG/5ML PO ELIX: 12.5000 mg | ORAL_SOLUTION | Freq: Four times a day (QID) | ORAL | Status: DC | PRN

## 2016-07-08 MED ORDER — ONDANSETRON HCL 4 MG/2ML IJ SOLN
INTRAMUSCULAR | Status: DC | PRN
  Administered 2016-07-08: 4 mg via INTRAVENOUS

## 2016-07-08 MED ORDER — ONDANSETRON HCL 4 MG/2ML IJ SOLN
INTRAMUSCULAR | Status: AC
Start: 2016-07-08 — End: 2016-07-08
  Filled 2016-07-08: qty 2

## 2016-07-08 MED ORDER — GLYCOPYRROLATE 0.2 MG/ML IJ SOLN
INTRAMUSCULAR | Status: DC | PRN
  Administered 2016-07-08: .1 mg via INTRAVENOUS

## 2016-07-08 MED ORDER — ONDANSETRON HCL 4 MG PO TABS: 4.0000 mg | ORAL_TABLET | Freq: Four times a day (QID) | ORAL | Status: DC | PRN

## 2016-07-08 MED ORDER — HYDROMORPHONE HCL 1 MG/ML IJ SOLN
INTRAMUSCULAR | Status: AC
  Filled 2016-07-08: qty 1

## 2016-07-08 MED ORDER — LIDOCAINE HCL (CARDIAC) 20 MG/ML IV SOLN
INTRAVENOUS | Status: DC | PRN
  Administered 2016-07-08: 50 mg via INTRAVENOUS

## 2016-07-08 MED ORDER — DIPHENHYDRAMINE HCL 50 MG/ML IJ SOLN: 12.5000 mg | Freq: Four times a day (QID) | INTRAMUSCULAR | Status: DC | PRN

## 2016-07-08 MED ORDER — HYDROMORPHONE 1 MG/ML IV SOLN
INTRAVENOUS | Status: DC
  Administered 2016-07-08: 2.1 mg via INTRAVENOUS
  Administered 2016-07-08: 15:00:00 via INTRAVENOUS
  Administered 2016-07-08: 1.8 mg via INTRAVENOUS
  Administered 2016-07-09: 1.5 mg via INTRAVENOUS
  Administered 2016-07-09: 0.9 mg via INTRAVENOUS
  Filled 2016-07-08: qty 25

## 2016-07-08 MED ORDER — SENNOSIDES-DOCUSATE SODIUM 8.6-50 MG PO TABS
1.0000 | ORAL_TABLET | Freq: Every evening | ORAL | Status: DC | PRN
  Administered 2016-07-09: 1 via ORAL
  Filled 2016-07-08: qty 1

## 2016-07-08 MED ORDER — SCOPOLAMINE 1 MG/3DAYS TD PT72
1.0000 | MEDICATED_PATCH | Freq: Once | TRANSDERMAL | Status: DC
  Administered 2016-07-08: 1.5 mg via TRANSDERMAL

## 2016-07-08 MED ORDER — BISACODYL 10 MG RE SUPP
10.0000 mg | Freq: Every day | RECTAL | Status: DC | PRN
Start: 2016-07-08 — End: 2016-07-12
  Administered 2016-07-09: 10 mg via RECTAL
  Filled 2016-07-08: qty 1

## 2016-07-08 MED ORDER — FENTANYL CITRATE (PF) 250 MCG/5ML IJ SOLN
INTRAMUSCULAR | Status: AC
  Filled 2016-07-08: qty 5

## 2016-07-08 MED ORDER — LIDOCAINE HCL (PF) 1 % IJ SOLN
INTRAMUSCULAR | Status: AC
  Filled 2016-07-08: qty 5

## 2016-07-08 MED ORDER — PNEUMOCOCCAL VAC POLYVALENT 25 MCG/0.5ML IJ INJ
0.5000 mL | INJECTION | INTRAMUSCULAR | Status: DC
  Filled 2016-07-08: qty 0.5

## 2016-07-08 MED ORDER — METOCLOPRAMIDE HCL 5 MG/ML IJ SOLN: 10.0000 mg | Freq: Once | INTRAMUSCULAR | Status: DC | PRN

## 2016-07-08 MED ORDER — MEPERIDINE HCL 25 MG/ML IJ SOLN: 6.2500 mg | INTRAMUSCULAR | Status: DC | PRN

## 2016-07-08 MED ORDER — KETOROLAC TROMETHAMINE 30 MG/ML IJ SOLN
INTRAMUSCULAR | Status: AC
  Filled 2016-07-08: qty 1

## 2016-07-08 MED ORDER — MENTHOL 3 MG MT LOZG: 1.0000 | LOZENGE | OROMUCOSAL | Status: DC | PRN

## 2016-07-08 MED ORDER — LACTATED RINGERS IV SOLN
INTRAVENOUS | Status: DC
  Administered 2016-07-08 (×3): via INTRAVENOUS

## 2016-07-08 MED ORDER — ALBUTEROL SULFATE (2.5 MG/3ML) 0.083% IN NEBU: 3.0000 mL | INHALATION_SOLUTION | Freq: Four times a day (QID) | RESPIRATORY_TRACT | Status: DC | PRN

## 2016-07-08 MED ORDER — ONDANSETRON HCL 4 MG/2ML IJ SOLN: 4.0000 mg | Freq: Four times a day (QID) | INTRAMUSCULAR | Status: DC | PRN

## 2016-07-08 MED ORDER — DEXAMETHASONE SODIUM PHOSPHATE 4 MG/ML IJ SOLN
INTRAMUSCULAR | Status: AC
  Filled 2016-07-08: qty 1

## 2016-07-08 SURGICAL SUPPLY — 38 items
CANISTER SUCT 3000ML PPV (MISCELLANEOUS) ×4 IMPLANT
CELLS DAT CNTRL 66122 CELL SVR (MISCELLANEOUS) IMPLANT
CLOTH BEACON ORANGE TIMEOUT ST (SAFETY) ×4 IMPLANT
CONT PATH 16OZ SNAP LID 3702 (MISCELLANEOUS) ×4 IMPLANT
DRAPE WARM FLUID 44X44 (DRAPE) ×4 IMPLANT
DRSG OPSITE POSTOP 4X10 (GAUZE/BANDAGES/DRESSINGS) ×4 IMPLANT
DRSG TELFA 3X8 NADH (GAUZE/BANDAGES/DRESSINGS) ×4 IMPLANT
DURAPREP 26ML APPLICATOR (WOUND CARE) ×4 IMPLANT
ELECT BLADE 6.5 EXT (BLADE) ×4 IMPLANT
GAUZE SPONGE 4X4 12PLY STRL LF (GAUZE/BANDAGES/DRESSINGS) ×4 IMPLANT
GAUZE SPONGE 4X4 16PLY XRAY LF (GAUZE/BANDAGES/DRESSINGS) ×4 IMPLANT
GLOVE BIO SURGEON STRL SZ7 (GLOVE) ×4 IMPLANT
GLOVE BIOGEL PI IND STRL 7.0 (GLOVE) ×8 IMPLANT
GLOVE BIOGEL PI INDICATOR 7.0 (GLOVE) ×8
GOWN STRL REUS W/TWL LRG LVL3 (GOWN DISPOSABLE) ×12 IMPLANT
HEMOSTAT ARISTA ABSORB 3G PWDR (MISCELLANEOUS) ×4 IMPLANT
NS IRRIG 1000ML POUR BTL (IV SOLUTION) ×4 IMPLANT
PACK ABDOMINAL GYN (CUSTOM PROCEDURE TRAY) ×4 IMPLANT
PAD ABD 7.5X8 STRL (GAUZE/BANDAGES/DRESSINGS) ×4 IMPLANT
PAD OB MATERNITY 4.3X12.25 (PERSONAL CARE ITEMS) ×4 IMPLANT
PENCIL SMOKE EVAC W/HOLSTER (ELECTROSURGICAL) ×4 IMPLANT
PROTECTOR NERVE ULNAR (MISCELLANEOUS) ×8 IMPLANT
RTRCTR WOUND ALEXIS 18CM MED (MISCELLANEOUS)
SPONGE LAP 18X18 X RAY DECT (DISPOSABLE) ×4 IMPLANT
SUT CHROMIC 2 0 CT 1 (SUTURE) ×4 IMPLANT
SUT MON AB 4-0 PS1 27 (SUTURE) ×4 IMPLANT
SUT PLAIN 2 0 XLH (SUTURE) ×4 IMPLANT
SUT VIC AB 0 CT1 18XCR BRD8 (SUTURE) ×4 IMPLANT
SUT VIC AB 0 CT1 27 (SUTURE) ×8
SUT VIC AB 0 CT1 27XCR 8 STRN (SUTURE) ×8 IMPLANT
SUT VIC AB 0 CT1 36 (SUTURE) ×8 IMPLANT
SUT VIC AB 0 CT1 8-18 (SUTURE) ×4
SUT VIC AB 2-0 CT1 27 (SUTURE) ×2
SUT VIC AB 2-0 CT1 TAPERPNT 27 (SUTURE) ×2 IMPLANT
SUT VICRYL 0 TIES 12 18 (SUTURE) ×4 IMPLANT
TAPE HYPAFIX 4 X10 (GAUZE/BANDAGES/DRESSINGS) ×4 IMPLANT
TOWEL OR 17X24 6PK STRL BLUE (TOWEL DISPOSABLE) ×8 IMPLANT
TRAY FOLEY CATH SILVER 14FR (SET/KITS/TRAYS/PACK) ×4 IMPLANT

## 2016-07-08 NOTE — Transfer of Care (Signed)
Immediate Anesthesia Transfer of Care Note  Patient: Abigail Gibson  Procedure(s) Performed: Procedure(s): HYSTERECTOMY ABDOMINAL WITH SALPINGECTOMY (Bilateral)  Patient Location: PACU  Anesthesia Type:General  Level of Consciousness: awake, alert  and oriented  Airway & Oxygen Therapy: Patient Spontanous Breathing and Patient connected to nasal cannula oxygen  Post-op Assessment: Report given to RN and Post -op Vital signs reviewed and stable  Post vital signs: Reviewed and stable  Last Vitals:  Vitals:   07/08/16 0746  BP: 137/90  Pulse: 83  Resp: 16  Temp: 36.9 C    Last Pain:  Vitals:   07/08/16 0746  TempSrc: Oral      Patients Stated Pain Goal: 3 (16/01/09 3235)  Complications: No apparent anesthesia complications

## 2016-07-08 NOTE — Anesthesia Procedure Notes (Signed)
Procedure Name: Intubation Date/Time: 07/08/2016 9:50 AM Performed by: Bufford Spikes Pre-anesthesia Checklist: Patient identified, Timeout performed, Emergency Drugs available, Suction available and Patient being monitored Patient Re-evaluated:Patient Re-evaluated prior to inductionOxygen Delivery Method: Circle system utilized Preoxygenation: Pre-oxygenation with 100% oxygen Intubation Type: IV induction Ventilation: Mask ventilation without difficulty Laryngoscope Size: Miller and 2 Grade View: Grade II Tube type: Oral Tube size: 7.0 mm Number of attempts: 1 Airway Equipment and Method: Stylet Placement Confirmation: ETT inserted through vocal cords under direct vision,  positive ETCO2 and breath sounds checked- equal and bilateral Secured at: 20 cm Tube secured with: Tape Dental Injury: Teeth and Oropharynx as per pre-operative assessment

## 2016-07-08 NOTE — Anesthesia Postprocedure Evaluation (Signed)
Anesthesia Post Note  Patient: Abigail Gibson  Procedure(s) Performed: Procedure(s) (LRB): HYSTERECTOMY ABDOMINAL WITH SALPINGECTOMY (Bilateral)  Patient location during evaluation: PACU Anesthesia Type: General Level of consciousness: awake and alert Pain management: pain level controlled Vital Signs Assessment: post-procedure vital signs reviewed and stable Respiratory status: spontaneous breathing, nonlabored ventilation, respiratory function stable and patient connected to nasal cannula oxygen Cardiovascular status: blood pressure returned to baseline and stable Postop Assessment: no signs of nausea or vomiting Anesthetic complications: no        Last Vitals:  Vitals:   07/08/16 0746 07/08/16 1215  BP: 137/90 (P) 128/73  Pulse: 83 (P) 78  Resp: 16 (P) 13  Temp: 36.9 C (P) 37.2 C    Last Pain:  Vitals:   07/08/16 0746  TempSrc: Oral   Pain Goal: Patients Stated Pain Goal: 3 (07/08/16 0746)               Montez Hageman

## 2016-07-08 NOTE — Anesthesia Preprocedure Evaluation (Signed)
Anesthesia Evaluation  Patient identified by MRN, date of birth, ID band Patient awake    Reviewed: Allergy & Precautions, NPO status , Patient's Chart, lab work & pertinent test results  Airway Mallampati: II  TM Distance: >3 FB Neck ROM: Full    Dental no notable dental hx.    Pulmonary neg pulmonary ROS, asthma ,    Pulmonary exam normal breath sounds clear to auscultation       Cardiovascular negative cardio ROS Normal cardiovascular exam Rhythm:Regular Rate:Normal     Neuro/Psych negative neurological ROS  negative psych ROS   GI/Hepatic negative GI ROS, Neg liver ROS,   Endo/Other  Morbid obesity  Renal/GU negative Renal ROS  negative genitourinary   Musculoskeletal negative musculoskeletal ROS (+)   Abdominal   Peds negative pediatric ROS (+)  Hematology negative hematology ROS (+)   Anesthesia Other Findings   Reproductive/Obstetrics negative OB ROS                             Anesthesia Physical Anesthesia Plan  ASA: II  Anesthesia Plan: General   Post-op Pain Management:    Induction: Intravenous  Airway Management Planned: Oral ETT  Additional Equipment:   Intra-op Plan:   Post-operative Plan: Extubation in OR  Informed Consent: I have reviewed the patients History and Physical, chart, labs and discussed the procedure including the risks, benefits and alternatives for the proposed anesthesia with the patient or authorized representative who has indicated his/her understanding and acceptance.   Dental advisory given  Plan Discussed with: CRNA  Anesthesia Plan Comments:         Anesthesia Quick Evaluation

## 2016-07-08 NOTE — Interval H&P Note (Signed)
History and Physical Interval Note:  07/08/2016 8:28 AM  Abigail Gibson  has presented today for surgery, with the diagnosis of R87.619 Abnormal Pap N83.292 Complex Cyst of Left Ovary D25.9 Fibroid  The various methods of treatment have been discussed with the patient and family. After consideration of risks, benefits and other options for treatment, the patient has consented to  Procedure(s): HYSTERECTOMY ABDOMINAL WITH SALPINGECTOMY (Bilateral) as a surgical intervention .  The patient's history has been reviewed, patient examined, no change in status, stable for surgery.  I have reviewed the patient's chart and labs.  Questions were answered to the patient's satisfaction.     CA-125 was normal.  Abigail Gibson

## 2016-07-08 NOTE — Brief Op Note (Signed)
07/08/2016  12:36 PM  PATIENT:  Abigail Gibson  56 y.o. female  PRE-OPERATIVE DIAGNOSIS:  Menorrhagia N83.292 Complex Cyst of Left Ovary D25.9 Fibroid  POST-OPERATIVE DIAGNOSIS:  Same, adhesions  PROCEDURE:  Procedure(s): HYSTERECTOMY ABDOMINAL WITH SALPINGECTOMY (Bilateral)  LOA  SURGEON:  Surgeon(s) and Role:    * Thurnell Lose, MD - Primary    * Janyth Pupa, DO - Assisting  PHYSICIAN ASSISTANT: None  ASSISTANTS: Technician   ANESTHESIA:   general   EBL:  Total I/O In: 1250 [I.V.:1250] Out: 550 [Urine:200; Blood:350]  BLOOD ADMINISTERED:none  DRAINS: Urinary Catheter (Foley)   LOCAL MEDICATIONS USED:  NONE  SPECIMEN:  Source of Specimen:  Uterus, cervix, bilateral fallopian tubes  DISPOSITION OF SPECIMEN:  PATHOLOGY  COUNTS:  YES  TOURNIQUET:  * No tourniquets in log *  DICTATION: .Other Dictation: Dictation Number [id number]  PLAN OF CARE: Admit to inpatient   PATIENT DISPOSITION:  PACU - hemodynamically stable.   Delay start of Pharmacological VTE agent (>24hrs) due to surgical blood loss or risk of bleeding: yes

## 2016-07-09 ENCOUNTER — Encounter (HOSPITAL_COMMUNITY): Payer: Self-pay | Admitting: Obstetrics and Gynecology

## 2016-07-09 LAB — CBC
HCT: 28.3 % — ABNORMAL LOW (ref 36.0–46.0)
Hemoglobin: 9.2 g/dL — ABNORMAL LOW (ref 12.0–15.0)
MCH: 25.3 pg — ABNORMAL LOW (ref 26.0–34.0)
MCHC: 32.5 g/dL (ref 30.0–36.0)
MCV: 77.7 fL — AB (ref 78.0–100.0)
PLATELETS: 586 10*3/uL — AB (ref 150–400)
RBC: 3.64 MIL/uL — ABNORMAL LOW (ref 3.87–5.11)
RDW: 19 % — AB (ref 11.5–15.5)
WBC: 11.7 10*3/uL — AB (ref 4.0–10.5)

## 2016-07-09 MED ORDER — LIDOCAINE HCL 1 % IJ SOLN
INTRAMUSCULAR | Status: AC
  Filled 2016-07-09: qty 20

## 2016-07-09 MED ORDER — HYDROMORPHONE HCL 1 MG/ML IJ SOLN
1.0000 mg | INTRAMUSCULAR | Status: DC | PRN
  Administered 2016-07-09 (×2): 1 mg via INTRAVENOUS
  Filled 2016-07-09 (×2): qty 1

## 2016-07-09 NOTE — Addendum Note (Signed)
Addendum  created 07/09/16 0830 by Flossie Dibble, CRNA   Sign clinical note

## 2016-07-09 NOTE — Progress Notes (Signed)
Dr. Simona Huh returned call and ok to dc pca and that the MD will be up around 1300 hours.

## 2016-07-09 NOTE — Progress Notes (Signed)
Levada Dy, Manager at bedside addressing cares and concerns.

## 2016-07-09 NOTE — Progress Notes (Signed)
Paged Dr. Simona Huh, patient wants to know when she is rounding

## 2016-07-09 NOTE — Op Note (Signed)
NAMEBOBBYE, PETTI NO.:  [id number]  MEDICAL RECORD NO.:  [id number]  LOCATION:                                FACILITY:  [facility]  PHYSICIAN:  Jola Schmidt, MD   DATE OF BIRTH:  1960/07/14  DATE OF PROCEDURE:  07/08/2016 DATE OF DISCHARGE:  07/12/2016                              OPERATIVE REPORT   PREOPERATIVE DIAGNOSIS:  Menorrhagia, fibroids, and complex cyst of the left ovary.  POSTOPERATIVE DIAGNOSIS:  Menorrhagia, fibroids, complex cyst of the left ovary, and adhesions.  PROCEDURE:  Total abdominal hysterectomy, bilateral salpingectomy, and lysis of adhesions.  SURGEON:  Raul Del. Simona Huh, MD.  ASSISTANT:  Janyth Pupa and technician.  ANESTHESIA:  General.  ESTIMATED BLOOD LOSS:  350.  URINE OUT:  200 mL of clear urine.  BLOOD ADMINISTERED:  None.  URINARY CATHETERIZATION/DRAINS:  Foley.  LOCAL USED:  No local use.  SPECIMEN:  Uterus, cervix, bilateral fallopian tubes.  DISPOSITION OF SPECIMEN:  To pathology.  PATIENT DISPOSITION:  To PACU, hemodynamically stable.  FINDINGS:  Grossly large fibroid uterus measuring 658 g.  Bowel adhered to left ovary and tube and to the back of the uterus and cervix.  Lower uterine segment and bladder flap were scarred.  Right ovary and fallopian tube appeared normal, but fimbriae was adhesed to the ovary. The left ovary was encased in adhesions, but this was normal.  INDICATIONS:  The patient was identified in the holding area.  She was then taken to the operating room with IV running.  She was placed in the dorsal supine position.  SCDs were placed.  Ancef 2 g IV was infused. She underwent general anesthesia without complication.  Traxi pannus retractor was used, and she was prepped and draped in a normal sterile fashion.  Foley catheter was placed.  A time-out was performed.  SCDs were confirmed to be operating at the time of the time-out.  Previous exploration of the patient's Pfannenstiel  skin incision showed a very scarred, adhesed Pfannenstiel skin incision.  The patient did have a history of wound breakdown after her cesarean section, that incision was actually too low, but it was also very scarred, so the decision was made to come up about 3 cm above that area.  A scalpel was used to incise the skin and then carried down to the underlying layer of the fascia.  There was a lot of subcutaneous tissue, probably about 2-3 inches.  The Bovie was used in that space.  The fascia was then incised at the midline and extended with the Mayo scissors.  The fascia was then taken off the rectus muscles, which there were significant adhesions and most difficult to delineate rectus from the fascia, that was done above and below the midline.  Incision was adhesed.  Two Kocher clamps were used to grasp the midline, and a scalpel was used to separate the scar tissue until the peritoneum was identified, that was then extended as needed with a Bovie.  I then identified the peritoneal area that appeared clear, that was grasped with the hemostat x2 and entered sharply with the Metzenbaum scissors, avoiding any bowel.  The abdomen was stretched and  again it did show some scarring.  There were some omental adhesions on the right-hand side to the peritoneum and the back of the rectus.  Once intraabdominal access was confirmed, the bowel was packed and an Fredia Sorrow retractor was inserted.  The uterus was quite twisted because of the fibroids.  I initially put in two figure-of-eight sutures on the fundus of the uterus for retraction, but that actually ended up being the posterior part of the uterus.  We explored the pelvis, saw the scarring, and attempted to do a portion of the surgery, but we would eventually take out the Fredia Sorrow for better visualization with the Chiropodist.  The round ligaments on the right were taken to help with mobilization, so we could be able  to see the uterus.  That was grasped with 2 Kocher clamps, entered sharply, and then suture ligated.  The space was then opened.  There was some scarring of the bladder, so could not really go under the bladder flap.  Before we placed the Alexis retractor, there was an omental adhesion that I took down with the Bovie.  I did palpate under the abdominal wall just to make sure there was no scarring.  Once the Alexis retractor was placed in the abdomen, the bowel was packed again with 4 moist laparotomy sponges.  Once the round ligament was taken, we attempted to open the bladder flap.  We then turned our attention to the left-hand side where we basically took off the left fallopian tube first.  The ovary was encased into the parametrium.  We could not see it, but we could feel it and there was also some bowel such as that, so we took off the fallopian tube by making a window in the mesosalpinx.  Of note, there were very thick vessels in the mesosalpinx.  Once we took off the transected fallopian tube, we then amputated it from the uterus because it was bleeding in the way.  We then made a window in the parametrium and the Kocher clamp x2 was used to take off the ovary from the uterus that was double clamped and double suture ligated with 0 Vicryl.  That pedicle was hemostatic.  We turned our attention to the right side.  We took off the right fallopian tube in the same manner and the ovary in the same manner.  I then skeletonized to get a good pedicle to grasp the uterine artery. I then realized that the uterus was rotated because we did have some bleeding, and the uterine arteries were more anterior than on the lateral side, so that was grasped with a Haney x2 and cut and sutured. I worked my way down until we could get the uterines clamped so that we would be able to amputate.  Dr. Nelda Marseille did something very similar on her side where she came down and clamped the uterus, skeletonized  her ovaries.  The uterine arteries were clamped and cut and then tied.  Then, once the uterus was blanching, we were able to amputate the uterus.  A malleable was placed behind and the scalpel was used to cut off the uterus.  Once we had better visualization, we attempted to skeletonize on the left-hand side, but there was some bowel, we cut, and then there was some fat there.  We explored very well.  There was no injury to the bowel, but there was some scarring of the bowel or some adherence of the bowel to the back of  the posterior cul-de-sac.  The adhesions were not dense, they were filmy, so we were able to get those off with pickups and Metzenbaum scissors.  We then skeletonized the cervix with straight Masterson and then used a curved Haney to remove the cervix.  The angles were tagged with 0 Vicryl and the cuff was closed in a running fashion with 0 Vicryl, but there was only about maybe 3-4 cm within the cuff that needed to be closed.  Once the cuff was closed, because of the dissection in the posterior cul- de-sac, there was some bleeding.  We used Bovie as needed.  We irrigated very well.  One of my clamps slid off as I was trying to get my pedicle so I went back and put a stitch in that because it was bleeding and then there was another area very close to that that was bleeding and put a 2- 0 Vicryl while making sure that there was no injury to the bowel.  After the abdomen was copiously irrigated, we did put Arista in just for hemostasis because some of the edges were really raw.  I then plicated the right ovary to the round ligament.  The cuff was hemostatic.  The bladder throughout the whole procedure, we were asking regarding the color of the urine.  There was no.  Once we got down the initial portion of the bladder, it came down very easily and we were very confident that we did not enter the bladder.  I wanted to get a good look at the left ovary because it was  abnormal on ultrasound and complex.  The CA-125 in the lab was normal though.  There were some adhesions encasing the ovary.  I lysed those with the Bovie very gently, they were quite filmy.  Then, we were able to clearly visualize the ovary and palpated and it appears normal.  The Alexis retractor was removed.  The bowel was inspected, omentum.  No bleeding noted.  Bovie was used to cauterize any bleeding on the fascia. Irrigation was performed.  Peritoneum was reapproximated with 2-0 Vicryl in a continuous running fashion, which was difficult because there were adhesions of the peritoneum and muscle together.  The fascia was then closed with 0 Vicryl with 2 sutures.  Then, 0 plain gut was used to reapproximate the subcutaneous space, which was closed in 2 layers.  The skin was then reapproximated with 4-0 Monocryl in a subcuticular fashion.  Benzoin, Steri-Strips, and a pressure dressing were to be applied.  All instrument, sponge, and needle counts were correct x3.  The patient was awakened in the OR and taken to the recovery room in stable condition.     Jola Schmidt, MD     EBV/MEDQ  D:  07/08/2016  T:  07/08/2016  Job:  [id number]

## 2016-07-09 NOTE — Anesthesia Postprocedure Evaluation (Signed)
Anesthesia Post Note  Patient: Abigail Gibson  Procedure(s) Performed: Procedure(s) (LRB): HYSTERECTOMY ABDOMINAL WITH SALPINGECTOMY (Bilateral) LYSIS OF ADHESION (N/A)  Patient location during evaluation: A-ICU Anesthesia Type: General Level of consciousness: awake and alert Pain management: satisfactory to patient Vital Signs Assessment: post-procedure vital signs reviewed and stable Respiratory status: spontaneous breathing and respiratory function stable Cardiovascular status: stable Postop Assessment: adequate PO intake Anesthetic complications: no        Last Vitals:  Vitals:   07/09/16 0500 07/09/16 0606  BP: (!) 110/58   Pulse: 73   Resp: 16 20  Temp: 37.1 C     Last Pain:  Vitals:   07/09/16 0724  TempSrc:   PainSc: Asleep   Pain Goal: Patients Stated Pain Goal: 4 (07/09/16 0724)               Katherina Mires

## 2016-07-09 NOTE — Progress Notes (Signed)
Patient is ambulating in the hall with the aid of two family members. Tolerating ambulation well at this point.

## 2016-07-09 NOTE — Progress Notes (Signed)
Patient will need work excuse to carry [place] to [place] when she is discharged.  This will be explained to on-coming RN.

## 2016-07-09 NOTE — Progress Notes (Signed)
1 Day Post-Op Procedure(s) (LRB): HYSTERECTOMY ABDOMINAL WITH SALPINGECTOMY (Bilateral) LYSIS OF ADHESION (N/A)  Subjective: Patient reports nausea and incisional pain.  Pt had nausea after eating a banana and egg this am.  +belching, no flatus. Pain not as well controlled without PCA.  Pt ambulated in halls last night but stopped b/c she got dizzy.  Objective: I have reviewed patient's vital signs, intake and output and labs. BP (!) 103/50 (BP Location: Right Arm)   Pulse 75   Temp 98.5 F (36.9 C) (Oral)   Resp 18   Ht 5\' 2"  (1.575 m)   Wt 109.3 kg (241 lb)   LMP 06/25/2016 (Exact Date)   SpO2 100%   BMI 44.08 kg/m    Gen:  NAD, moderate pain with movement. Abd:  Hypoactive BS, soft, obese, not distended.  Pressure dressing dry. Ext:  No calf tenderness.  Assessment: s/p Procedure(s): HYSTERECTOMY ABDOMINAL WITH SALPINGECTOMY (Bilateral) LYSIS OF ADHESION (N/A): stable  Plan: Discontinue IV fluids Clear liquids PO pain medications. Diluadid IV prn severe pain ordered.  Encouraged ambulation to increase GI motility. Soft diet until GI function improves. Ultram and Tylenol for pain.  No NSAIDs. Pt informed that [facility] would be covering tomorrow.   LOS: 1 day    Ambrosia Wisnewski 07/09/2016, 1:57 PM

## 2016-07-09 NOTE — Progress Notes (Signed)
MD at bedside. 

## 2016-07-10 ENCOUNTER — Inpatient Hospital Stay (HOSPITAL_COMMUNITY): Payer: Self-pay

## 2016-07-10 LAB — COMPREHENSIVE METABOLIC PANEL
ALBUMIN: 3.1 g/dL — AB (ref 3.5–5.0)
ALK PHOS: 43 U/L (ref 38–126)
ALT: 12 U/L — AB (ref 14–54)
AST: 18 U/L (ref 15–41)
Anion gap: 7 (ref 5–15)
CALCIUM: 8.2 mg/dL — AB (ref 8.9–10.3)
CHLORIDE: 101 mmol/L (ref 101–111)
CO2: 30 mmol/L (ref 22–32)
CREATININE: 0.45 mg/dL (ref 0.44–1.00)
GFR calc Af Amer: >60 mL/min (ref >60)
GFR calc non Af Amer: >60 mL/min (ref >60)
GLUCOSE: 113 mg/dL — AB (ref 65–99)
Potassium: 3 mmol/L — ABNORMAL LOW (ref 3.5–5.1)
SODIUM: 138 mmol/L (ref 135–145)
Total Bilirubin: 0.7 mg/dL (ref 0.3–1.2)
Total Protein: 6.9 g/dL (ref 6.5–8.1)

## 2016-07-10 LAB — CBC WITH DIFFERENTIAL/PLATELET
Basophils Absolute: 0 10*3/uL (ref 0.0–0.1)
Basophils Relative: 0 %
EOS ABS: 0.2 10*3/uL (ref 0.0–0.7)
Eosinophils Relative: 2 %
HCT: 26.7 % — ABNORMAL LOW (ref 36.0–46.0)
HEMOGLOBIN: 8.7 g/dL — AB (ref 12.0–15.0)
LYMPHS ABS: 1.6 10*3/uL (ref 0.7–4.0)
Lymphocytes Relative: 13 %
MCH: 25.3 pg — AB (ref 26.0–34.0)
MCHC: 32.6 g/dL (ref 30.0–36.0)
MCV: 77.6 fL — ABNORMAL LOW (ref 78.0–100.0)
MONO ABS: 1.1 10*3/uL — AB (ref 0.1–1.0)
MONOS PCT: 9 %
NEUTROS PCT: 76 %
Neutro Abs: 9.7 10*3/uL — ABNORMAL HIGH (ref 1.7–7.7)
Platelets: 551 10*3/uL — ABNORMAL HIGH (ref 150–400)
RBC: 3.44 MIL/uL — ABNORMAL LOW (ref 3.87–5.11)
RDW: 18.9 % — AB (ref 11.5–15.5)
WBC: 12.6 10*3/uL — ABNORMAL HIGH (ref 4.0–10.5)

## 2016-07-10 LAB — URINALYSIS, ROUTINE W REFLEX MICROSCOPIC
Bilirubin Urine: NEGATIVE
Glucose, UA: NEGATIVE mg/dL
HGB URINE DIPSTICK: NEGATIVE
Ketones, ur: NEGATIVE mg/dL
LEUKOCYTES UA: NEGATIVE
NITRITE: NEGATIVE
Protein, ur: NEGATIVE mg/dL
SPECIFIC GRAVITY, URINE: 1.005 (ref 1.005–1.030)
pH: 7 (ref 5.0–8.0)

## 2016-07-10 MED ORDER — FAMOTIDINE 20 MG PO TABS
20.0000 mg | ORAL_TABLET | Freq: Two times a day (BID) | ORAL | Status: DC
  Administered 2016-07-10: 20 mg via ORAL
  Filled 2016-07-10: qty 1

## 2016-07-10 MED ORDER — IOPAMIDOL (ISOVUE-300) INJECTION 61%
100.0000 mL | Freq: Once | INTRAVENOUS | Status: AC | PRN
  Administered 2016-07-10: 100 mL via INTRAVENOUS

## 2016-07-10 MED ORDER — IOPAMIDOL (ISOVUE-300) INJECTION 61%: 30.0000 mL | INTRAVENOUS | Status: AC

## 2016-07-10 MED ORDER — POTASSIUM CHLORIDE CRYS ER 20 MEQ PO TBCR
40.0000 meq | EXTENDED_RELEASE_TABLET | Freq: Two times a day (BID) | ORAL | Status: AC
  Administered 2016-07-10 – 2016-07-12 (×3): 40 meq via ORAL
  Filled 2016-07-10 (×4): qty 2

## 2016-07-10 MED ORDER — SODIUM CHLORIDE 0.9 % IV SOLN
3.0000 g | Freq: Four times a day (QID) | INTRAVENOUS | Status: DC
  Administered 2016-07-10 – 2016-07-11 (×6): 3 g via INTRAVENOUS
  Filled 2016-07-10 (×7): qty 3

## 2016-07-10 MED ORDER — FAMOTIDINE 20 MG PO TABS
20.0000 mg | ORAL_TABLET | ORAL | Status: AC
  Administered 2016-07-10: 20 mg via ORAL
  Filled 2016-07-10: qty 1

## 2016-07-10 MED ORDER — FAMOTIDINE 20 MG PO TABS
40.0000 mg | ORAL_TABLET | Freq: Two times a day (BID) | ORAL | Status: DC
  Administered 2016-07-10 – 2016-07-11 (×3): 40 mg via ORAL
  Filled 2016-07-10 (×3): qty 2

## 2016-07-10 NOTE — Progress Notes (Signed)
Called [facility] to discuss labs and temp (99.43F at present) orally. Patient also reporting abdominal tenderness to touch and chills. Doctor advised she will come see patient

## 2016-07-10 NOTE — Progress Notes (Signed)
Patient passing gas and has hypo bowel sounds. Prune juice and soup and hot tea ordered by family.

## 2016-07-10 NOTE — Progress Notes (Signed)
Patient awake, alert and has no complaints of pain or tenderness or chills. No vaginal bleeding or discharge. Patient and family member concerned regarding elevated temperature and possible discharge today. Advised lab to come draw and doctor to review prior to making a decision regarding discharge.

## 2016-07-10 NOTE — Progress Notes (Addendum)
2 Days Post-Op Procedure(s) (LRB): HYSTERECTOMY ABDOMINAL WITH SALPINGECTOMY (Bilateral) LYSIS OF ADHESION (N/A)  Subjective: Patient reports upper to mid abdominal pain.  Low grade fever overnight and chills early this morning.  Pt had an episode of emesis at 1730 yesterday after drinking orange juice but since she has pass gas and tolerated a soft diet.  No pain with palpation of incision.  Denies vaginal bleeding. Sister-in-law with concerns that pt is putting her finger in her vagina to clean herself which is her practice in her country.  Objective: I have reviewed patient's vital signs, intake and output and labs. BP 117/71 (BP Location: Left Arm)   Pulse 90   Temp 99.6 F (37.6 C)   Resp 20   Ht 5\' 2"  (1.575 m)   Wt 109.3 kg (241 lb)   LMP 06/25/2016 (Exact Date)   SpO2 94%   BMI 44.08 kg/m   TMax 100.5 at 2000  Gen:  NAD, appears comfortable.  Pt does not show signs of pain when abdomen is auscultated. CV:  RRR Lungs:  CTA bilaterally Abd:  Hypoactive BS in upper abdomen, active in lower quadrants, soft, obese, not distended.  Dressing dry, scant amount of old blood. Ext:  No calf tenderness.  CBC Latest Ref Rng & Units 07/10/2016 07/09/2016 07/03/2016  WBC 4.0 - 10.5 K/uL 12.6(H) 11.7(H) 5.9  Hemoglobin 12.0 - 15.0 g/dL 8.7(L) 9.2(L) 10.9(L)  Hematocrit 36.0 - 46.0 % 26.7(L) 28.3(L) 34.0(L)  Platelets 150 - 400 K/uL 551(H) 586(H) 552(H)   Urinalysis neg  Assessment: s/p Procedure(s): HYSTERECTOMY ABDOMINAL WITH SALPINGECTOMY (Bilateral) LYSIS OF ADHESION (N/A)  Post op Fever Initially concerned about intrabdominal infection due to occult bowel injury.  No injury suspected intraop with lysis of adhesions.  Pt appears well clinically, abdomen is not as tender as would be expected nor is there significant leukocytosis. Pt with h/o wound breakdown x 2 with previous surgeries which may or may not be due to surgical practices in [place]. UA neg No s/sxs of pulmonary  infection. Tolerating a regular diet.  Plan: CT w/ contrast abd/pelvis.  CMP ordered for contrast. IV antibiotics. Pepcid for epigastric pain. Encouraged ambulation in halls. Will inform pt not to put anything in the vagina. Due to h/o wound breakdown, apply PICO dressing and consider po antibiotics at discharge for prophylaxis. Dr. Mancel Bale will assume care at noon.   LOS: 2 days    Kyleen Villatoro 07/10/2016, 11:38 AM

## 2016-07-11 NOTE — Progress Notes (Addendum)
Subjective: Patient reports incisional pain, tolerating PO, + flatus and no problems voiding.    Objective: I have reviewed patient's vital signs. BP 123/61 (BP Location: Right Arm)   Pulse 90   Temp 99.3 F (37.4 C)   Resp 18   Ht 5\' 2"  (1.575 m)   Wt 241 lb (109.3 kg)   LMP 06/25/2016 (Exact Date)   SpO2 97%   BMI 44.08 kg/m  Physical Examination: General appearance - alert, well appearing, and in no distress and oriented to person, place, and time Chest - clear to auscultation, no wheezes, rales or rhonchi, symmetric air entry, no tachypnea, retractions or cyanosis Heart - normal rate, regular rhythm, normal S1, S2, no murmurs, rubs, clicks or gallops, normal rate and regular rhythm Abdomen - tenderness noted general Musculoskeletal - not examined Skin - normal coloration and turgor, no rashes, no suspicious skin lesions noted   Assessment/Plan: Post Total Abdominal Hysterectomy Febrile condition (Low Grade) Probable discharge tomorrow Continue routine orders  LOS: 3 days    Cecille Rubin A Clemmons CNM 07/11/2016, 11:57 AM  Agree with above.  Pt has no complaints.  She is ambulating without difficulty.  Will cont to observe overnight due to low grade temps.  Anticipate discharge tomorrow.

## 2016-07-12 MED ORDER — TRAMADOL HCL 50 MG PO TABS: 50.0000 mg | ORAL_TABLET | Freq: Four times a day (QID) | ORAL | 0 refills | Status: DC | PRN

## 2016-07-12 NOTE — Discharge Summary (Signed)
Physician Discharge Summary  Patient ID: Abigail Gibson MRN: [id number] DOB/AGE: 55/55/1962 55 y.o.  Admit date: 07/08/2016 Discharge date: 07/12/2016  Admission Diagnoses: Symptomatic Fibroids  Discharge Diagnoses:  Active Problems:   S/P TAH (total abdominal hysterectomy)/BS/LOA   Discharged Condition: good  Hospital Course: Pt underwent and uneventful hysterectomy.  She had low grade fevers post op and was placed on unasyn.  She had a CT scan which was negative.  Temperatures improved with a daily downward trend and ultimately pt was discontinued off antibiotics (IV was not restarted after infiltration) and pt continued to be afebrile and doing well.  She was voiding without difficulty, tolerating po, passing flatus and had good bowel sounds.  D/c instructions were reviewed with the patient and family member who speaks english and questions answered.  Pt instructed to call office on Monday for an appt in the office this coming week to have dressing removed and to f/u incision and low grade temps.  Consults: None  Significant Diagnostic Studies: CT scan - negative  Treatments: IV hydration, antibiotics: Unasyn, surgery: TAH/BS/LOA and observation  Discharge Exam: Blood pressure (!) 109/50, pulse 79, temperature 98.9 F (37.2 C), temperature source Oral, resp. rate 19, height 5\' 2"  (1.575 m), weight 241 lb (109.3 kg), last menstrual period 06/25/2016, SpO2 99 %. General appearance: alert and no distress Resp: clear to auscultation bilaterally Cardio: regular rate and rhythm GI: soft, app tender, NABS, ND, no rebound or guarding, pico dressing in place No vaginal bleeding Incision/Wound:pico dressing in place, c/d  Disposition: 01-Home or Self Care   Allergies as of 07/12/2016      Reactions   Ibuprofen Other (See Comments)   Pt states that this medication triggers her asthma.     Shrimp [shellfish Allergy] Swelling   Of tongue and itching      Medication List    TAKE these  medications   albuterol 108 (90 Base) MCG/ACT inhaler Commonly known as:  PROVENTIL HFA;VENTOLIN HFA Inhale 1-2 puffs into the lungs every 6 (six) hours as needed for wheezing or shortness of breath.   BETAMETHASONE PO Take 2 mg by mouth every evening. Celestene 2mg    ferrous sulfate 325 (65 FE) MG tablet Take 325 mg by mouth 2 (two) times daily.   traMADol 50 MG tablet Commonly known as:  ULTRAM Take 1 tablet (50 mg total) by mouth every 6 (six) hours as needed. What changed:  reasons to take this   traMADol 50 MG tablet Commonly known as:  ULTRAM Take 1 tablet (50 mg total) by mouth every 6 (six) hours as needed for moderate pain. What changed:  You were already taking a medication with the same name, and this prescription was added. Make sure you understand how and when to take each.   TYLENOL 8 HOUR ARTHRITIS PAIN 650 MG CR tablet Generic drug:  acetaminophen Take 650 mg by mouth every 8 (eight) hours as needed for pain.        SignedDelice Lesch 07/12/2016, 8:00 AM

## 2016-07-12 NOTE — Progress Notes (Signed)

## 2016-07-28 ENCOUNTER — Ambulatory Visit (INDEPENDENT_AMBULATORY_CARE_PROVIDER_SITE_OTHER): Payer: Self-pay | Admitting: Family Medicine

## 2016-07-28 VITALS — BP 124/82 | HR 69 | Temp 98.8°F | Resp 17 | Ht 62.0 in | Wt 233.0 lb

## 2016-07-28 DIAGNOSIS — Z13220 Encounter for screening for lipoid disorders: Secondary | ICD-10-CM

## 2016-07-28 DIAGNOSIS — J4540 Moderate persistent asthma, uncomplicated: Secondary | ICD-10-CM

## 2016-07-28 DIAGNOSIS — Z90710 Acquired absence of both cervix and uterus: Secondary | ICD-10-CM

## 2016-07-28 DIAGNOSIS — D500 Iron deficiency anemia secondary to blood loss (chronic): Secondary | ICD-10-CM

## 2016-07-28 DIAGNOSIS — M170 Bilateral primary osteoarthritis of knee: Secondary | ICD-10-CM

## 2016-07-28 NOTE — Progress Notes (Signed)
Chief Complaint  Patient presents with  . Asthma    follow up  . Gastroesophageal Reflux    Wants to know everything is okay    HPI  ID number: [id number]  Asthma She reports that she still wheezes at night She reports that she is still taking her oral steroids as prescribed back home   Anemia Pt was given iron to take for her anemia three times a day She reports that she is taking the iron  Lab Results  Component Value Date   HGB 8.7 (L) 07/10/2016   S/P hysterectomy She is taking tramadol for pain She has aches in the morning but as the day goes on and with tylenol it helps   She reports that when she is sleeping she feels like she is screaming and when she wakes up she was not screaming and the doctor back home ([place]) told her that she has a circulation problem.  She wanted to check that everything is normal before she returns back home.      Past Medical History:  Diagnosis Date  . Arthritis    both knees  . Asthma   . Fibroid   . GERD (gastroesophageal reflux disease)     Current Outpatient Prescriptions  Medication Sig Dispense Refill  . acetaminophen (TYLENOL 8 HOUR ARTHRITIS PAIN) 650 MG CR tablet Take 650 mg by mouth every 8 (eight) hours as needed for pain.    Marland Kitchen albuterol (PROVENTIL HFA;VENTOLIN HFA) 108 (90 Base) MCG/ACT inhaler Inhale 1-2 puffs into the lungs every 6 (six) hours as needed for wheezing or shortness of breath.    Marland Kitchen BETAMETHASONE PO Take 2 mg by mouth every evening. Celestene 2mg     . ferrous sulfate 325 (65 FE) MG tablet Take 325 mg by mouth 2 (two) times daily.    . traMADol (ULTRAM) 50 MG tablet Take 1 tablet (50 mg total) by mouth every 6 (six) hours as needed. (Patient taking differently: Take 50 mg by mouth every 6 (six) hours as needed (for pain.). ) 30 tablet 0   No current facility-administered medications for this visit.     Allergies:  Allergies  Allergen Reactions  . Ibuprofen Other (See Comments)    Pt states that this  medication triggers her asthma.    . Shrimp [Shellfish Allergy] Swelling    Of tongue and itching    Past Surgical History:  Procedure Laterality Date  . CESAREAN SECTION    . HYSTERECTOMY ABDOMINAL WITH SALPINGECTOMY Bilateral 07/08/2016   Procedure: HYSTERECTOMY ABDOMINAL WITH SALPINGECTOMY;  Surgeon: Thurnell Lose, MD;  Location: [facility];  Service: Gynecology;  Laterality: Bilateral;  . LYSIS OF ADHESION N/A 07/08/2016   Procedure: LYSIS OF ADHESION;  Surgeon: Thurnell Lose, MD;  Location: [facility];  Service: Gynecology;  Laterality: N/A;  . MYOMECTOMY      Social History   Social History  . Marital status: Married    Spouse name: N/A  . Number of children: N/A  . Years of education: N/A   Social History Main Topics  . Smoking status: Never Smoker  . Smokeless tobacco: Never Used  . Alcohol use No  . Drug use: No  . Sexual activity: Not Asked   Other Topics Concern  . None   Social History Narrative  . None    Review of Systems  Constitutional: Negative for chills, fever and weight loss.  Respiratory: Positive for wheezing. Negative for cough and hemoptysis.   Cardiovascular: Negative for chest pain, palpitations  and orthopnea.  Gastrointestinal: Negative for abdominal pain, nausea and vomiting.    Objective: Vitals:   07/28/16 1158  BP: 124/82  Pulse: 69  Resp: 17  Temp: 98.8 F (37.1 C)  TempSrc: Oral  SpO2: 99%  Weight: 233 lb (105.7 kg)  Height: 5\' 2"  (1.575 m)    Physical Exam  Constitutional: She is oriented to person, place, and time. She appears well-developed and well-nourished.  HENT:  Head: Normocephalic and atraumatic.  Eyes: Conjunctivae are normal.  Cardiovascular: Normal rate, regular rhythm and normal heart sounds.   No murmur heard. Pulmonary/Chest: Effort normal and breath sounds normal. No respiratory distress. She has no wheezes.  Abdominal: Soft. Bowel sounds are normal. She exhibits no distension. There is no tenderness. There is  no guarding.  Neurological: She is alert and oriented to person, place, and time.  Skin: Skin is warm. Capillary refill takes less than 2 seconds.     Assessment and Plan Abigail Gibson was seen today for asthma and gastroesophageal reflux.  Diagnoses and all orders for this visit:  Screening, lipid -     Lipid panel  Moderate persistent asthma without complication -   Lung exam clear today -  Continue current asthma medications  Primary osteoarthritis of both knees -   Continue pain meds and topical rubs  S/P hysterectomy -   Reviewed last CT abd 07/10/16 with pt Discussed that results are normal She should continue tid iron Continue to ambulate but no lifting of greater than 5-10 pounds She returns to [place] after receiving surgical clearance  IDA- continue iron supplementation   Adryan Druckenmiller A Bowman Higbie

## 2016-07-28 NOTE — Patient Instructions (Signed)
     IF you received an x-ray today, you will receive an invoice from [facility]. Please contact [facility] at [phone] with questions or concerns regarding your invoice.   IF you received labwork today, you will receive an invoice from [facility]. Please contact [facility] at [phone] with questions or concerns regarding your invoice.   Our billing staff will not be able to assist you with questions regarding bills from these companies.  You will be contacted with the lab results as soon as they are available. The fastest way to get your results is to activate your My Chart account. Instructions are located on the last page of this paperwork. If you have not heard from us regarding the results in 2 weeks, please contact this office.     

## 2016-07-29 LAB — LIPID PANEL
CHOL/HDL RATIO: 3.9 ratio (ref 0.0–4.4)
CHOLESTEROL TOTAL: 209 mg/dL — AB (ref 100–199)
HDL: 54 mg/dL (ref >39)
LDL CALC: 127 mg/dL — AB (ref 0–99)
TRIGLYCERIDES: 138 mg/dL (ref 0–149)
VLDL CHOLESTEROL CAL: 28 mg/dL (ref 5–40)

## 2016-08-14 ENCOUNTER — Ambulatory Visit (INDEPENDENT_AMBULATORY_CARE_PROVIDER_SITE_OTHER): Payer: Self-pay | Admitting: Family Medicine

## 2016-08-14 ENCOUNTER — Encounter: Payer: Self-pay | Admitting: Family Medicine

## 2016-08-14 VITALS — BP 131/84 | HR 68 | Temp 98.5°F | Resp 17 | Ht 62.0 in | Wt 233.0 lb

## 2016-08-14 DIAGNOSIS — J4540 Moderate persistent asthma, uncomplicated: Secondary | ICD-10-CM

## 2016-08-14 DIAGNOSIS — M1990 Unspecified osteoarthritis, unspecified site: Secondary | ICD-10-CM

## 2016-08-14 DIAGNOSIS — D250 Submucous leiomyoma of uterus: Secondary | ICD-10-CM

## 2016-08-14 DIAGNOSIS — M170 Bilateral primary osteoarthritis of knee: Secondary | ICD-10-CM

## 2016-08-14 MED ORDER — ACETAMINOPHEN 500 MG PO TABS
500.0000 mg | ORAL_TABLET | Freq: Once | ORAL | Status: AC
  Administered 2016-08-14: 500 mg via ORAL

## 2016-08-14 NOTE — Progress Notes (Signed)
Chief Complaint  Patient presents with  . Knee Pain    rt has arthritis onset 5 days    HPI Pt reports that she has been cleared to return home to [place] by Gynecology She denies any heavy bleeding or pelvic pain She denies fevers or chills  Pt reports that she has finished the Celestone steroid  Pt also report that she has been having some knee pain for the past couple of days She states that she had a limp on the right side She states that her knee pain was worse    Past Medical History:  Diagnosis Date  . Arthritis    both knees  . Asthma   . Fibroid   . GERD (gastroesophageal reflux disease)     Current Outpatient Prescriptions  Medication Sig Dispense Refill  . acetaminophen (TYLENOL 8 HOUR ARTHRITIS PAIN) 650 MG CR tablet Take 650 mg by mouth every 8 (eight) hours as needed for pain.    Marland Kitchen albuterol (PROVENTIL HFA;VENTOLIN HFA) 108 (90 Base) MCG/ACT inhaler Inhale 1-2 puffs into the lungs every 6 (six) hours as needed for wheezing or shortness of breath.    Marland Kitchen BETAMETHASONE PO Take 2 mg by mouth every evening. Celestene 2mg     . ferrous sulfate 325 (65 FE) MG tablet Take 325 mg by mouth 2 (two) times daily.    . traMADol (ULTRAM) 50 MG tablet Take 1 tablet (50 mg total) by mouth every 6 (six) hours as needed. (Patient taking differently: Take 50 mg by mouth every 6 (six) hours as needed (for pain.). ) 30 tablet 0   No current facility-administered medications for this visit.     Allergies:  Allergies  Allergen Reactions  . Ibuprofen Other (See Comments)    Pt states that this medication triggers her asthma.    . Shrimp [Shellfish Allergy] Swelling    Of tongue and itching    Past Surgical History:  Procedure Laterality Date  . CESAREAN SECTION    . HYSTERECTOMY ABDOMINAL WITH SALPINGECTOMY Bilateral 07/08/2016   Procedure: HYSTERECTOMY ABDOMINAL WITH SALPINGECTOMY;  Surgeon: Thurnell Lose, MD;  Location: [facility];  Service: Gynecology;  Laterality: Bilateral;  .  LYSIS OF ADHESION N/A 07/08/2016   Procedure: LYSIS OF ADHESION;  Surgeon: Thurnell Lose, MD;  Location: [facility];  Service: Gynecology;  Laterality: N/A;  . MYOMECTOMY      Social History   Social History  . Marital status: Married    Spouse name: N/A  . Number of children: N/A  . Years of education: N/A   Social History Main Topics  . Smoking status: Never Smoker  . Smokeless tobacco: Never Used  . Alcohol use No  . Drug use: No  . Sexual activity: Not Asked   Other Topics Concern  . None   Social History Narrative  . None    Review of Systems  Constitutional: Negative for chills and fever.  Respiratory: Positive for shortness of breath and wheezing. Negative for cough and sputum production.   Cardiovascular: Negative for chest pain and palpitations.  Gastrointestinal: Negative for abdominal pain, nausea and vomiting.  Skin: Negative for itching and rash.    Objective: Vitals:   08/14/16 1546  BP: 131/84  Pulse: 68  Resp: 17  Temp: 98.5 F (36.9 C)  TempSrc: Oral  SpO2: 100%  Weight: 233 lb (105.7 kg)  Height: 5\' 2"  (1.575 m)    Physical Exam  Constitutional: She is oriented to person, place, and time. She appears well-developed  and well-nourished.  HENT:  Head: Normocephalic and atraumatic.  Eyes: Conjunctivae and EOM are normal.  Cardiovascular: Normal rate, regular rhythm and normal heart sounds.   Pulmonary/Chest: Effort normal and breath sounds normal. No respiratory distress. She has no wheezes.  Abdominal: Soft. Bowel sounds are normal. She exhibits no distension. There is no tenderness.  Neurological: She is alert and oriented to person, place, and time.  Skin: Skin is warm. No erythema.  Psychiatric: She has a normal mood and affect. Her behavior is normal. Judgment and thought content normal.    Assessment and Plan [place] was seen today for knee pain.  Diagnoses and all orders for this visit:  Arthritis-  Advised pt to take tylenol  arthritis for pain -     acetaminophen (TYLENOL) tablet 500 mg; Take 1 tablet (500 mg total) by mouth once.  Moderate persistent asthma without complication- she is leaving for [place] in a few days Her lung exam is clear She declined any medication changes and due to cost will continue her celestone  Primary osteoarthritis of both knees- advised topical biofreeze  Submucous uterine fibroid- cleared by Gynecology to return home     [place]

## 2016-08-14 NOTE — Patient Instructions (Signed)
     IF you received an x-ray today, you will receive an invoice from [facility]. Please contact [facility] at [phone] with questions or concerns regarding your invoice.   IF you received labwork today, you will receive an invoice from [facility]. Please contact [facility] at [phone] with questions or concerns regarding your invoice.   Our billing staff will not be able to assist you with questions regarding bills from these companies.  You will be contacted with the lab results as soon as they are available. The fastest way to get your results is to activate your My Chart account. Instructions are located on the last page of this paperwork. If you have not heard from us regarding the results in 2 weeks, please contact this office.     

## 2017-02-26 ENCOUNTER — Ambulatory Visit: Payer: Self-pay | Admitting: *Deleted

## 2017-02-26 NOTE — Telephone Encounter (Signed)
Called pt to follow up regarding her call to [place] about a cough.  Left message to return the call.

## 2018-01-25 IMAGING — US US TRANSVAGINAL NON-OB
1 series · 15 of 25 positions shown · non-contrast
Comparison: None.

CLINICAL DATA: Pelvic pain, cramping

EXAM:
TRANSABDOMINAL AND TRANSVAGINAL ULTRASOUND OF PELVIS
DOPPLER ULTRASOUND OF OVARIES
TECHNIQUE: Both transabdominal and transvaginal ultrasound examinations of the
pelvis were performed. Transabdominal technique was performed for
global imaging of the pelvis including uterus, ovaries, adnexal
regions, and pelvic cul-de-sac.
It was necessary to proceed with endovaginal exam following the
transabdominal exam to visualize the endometrium and ovaries. Color
and duplex Doppler ultrasound was utilized to evaluate blood flow to
the ovaries.

[Series 1: us transvaginal non-ob · 15 of 79 slices shown]
[im 1/79]
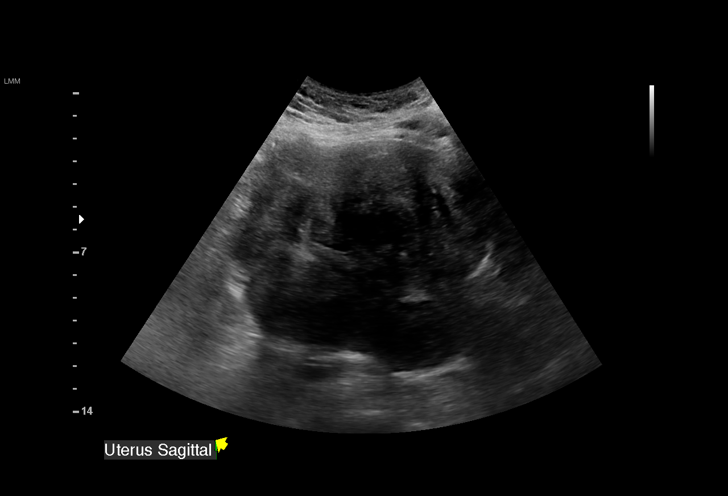
[im 7/79]
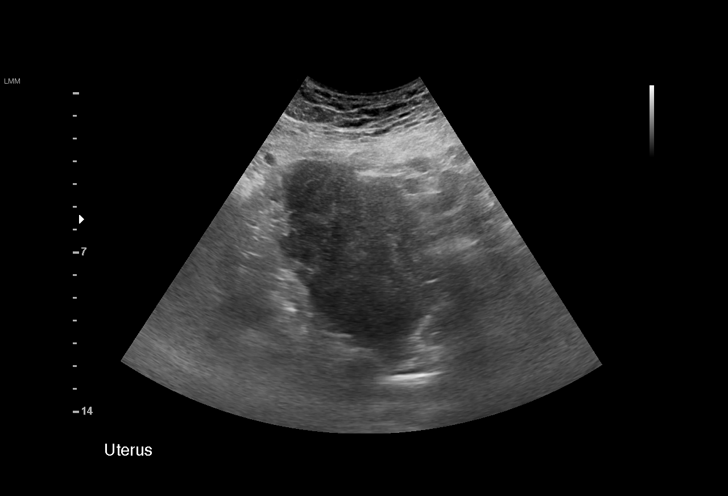
[im 14/79]
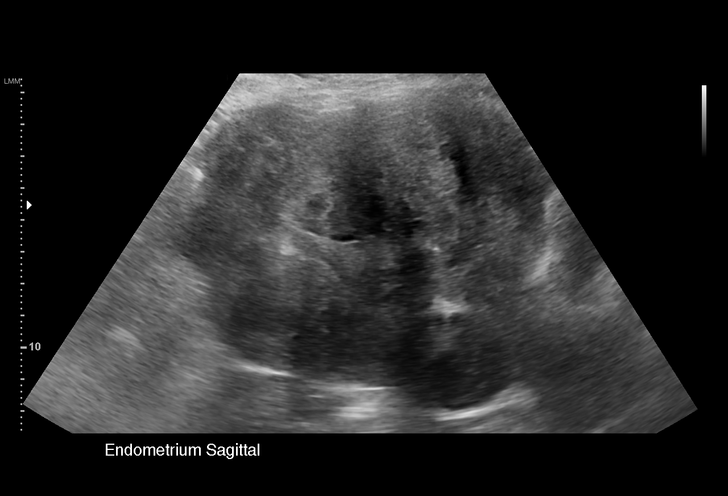
[im 17/79]
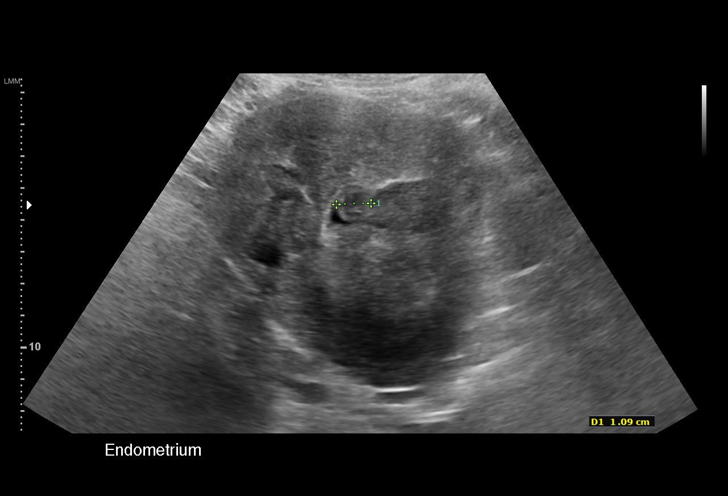
[im 23/79]
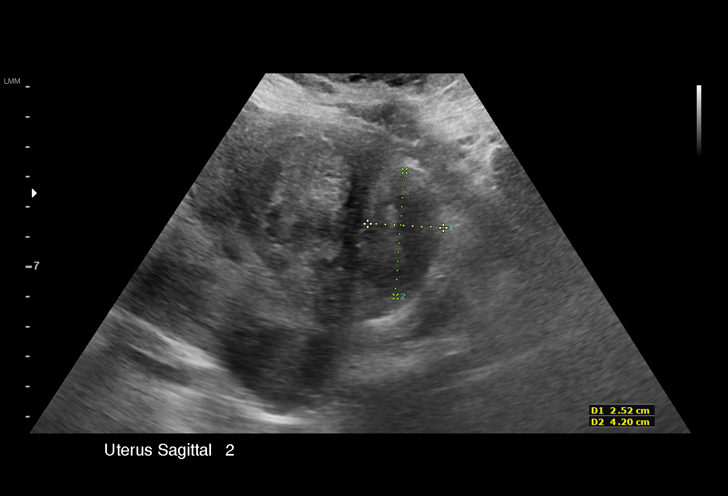
[im 30/79]
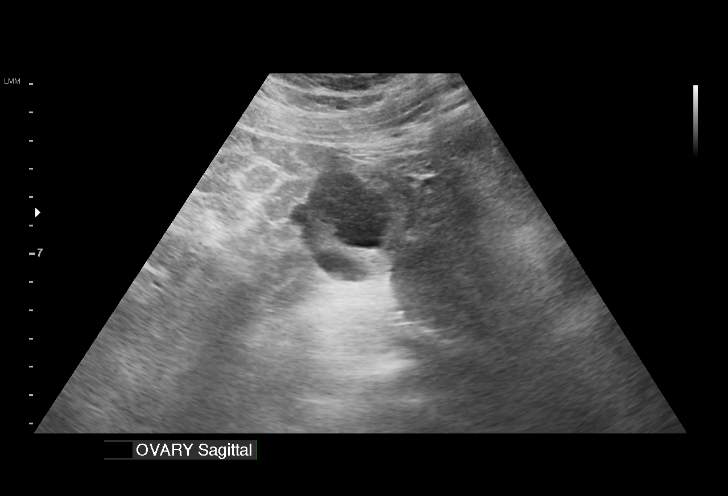
[im 33/79]
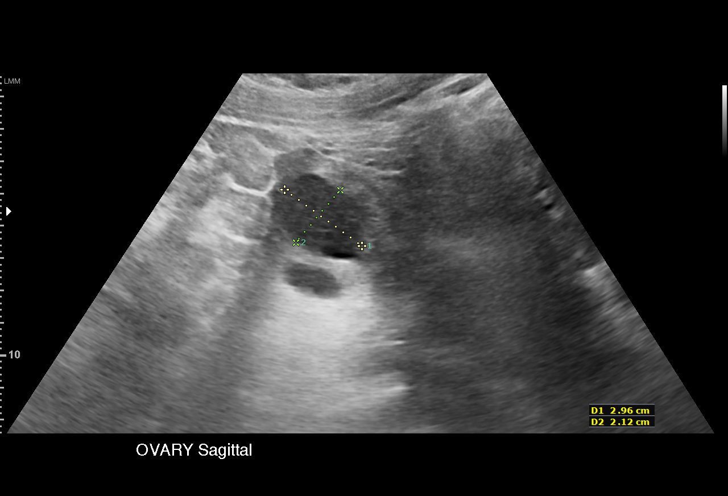
[im 40/79]
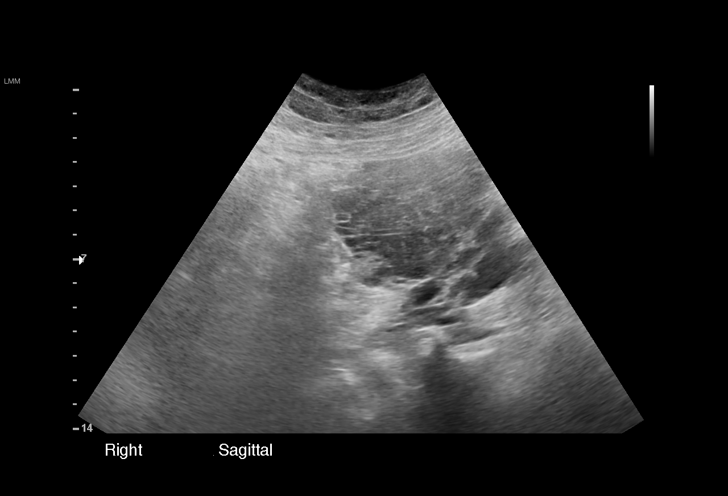
[im 46/79]
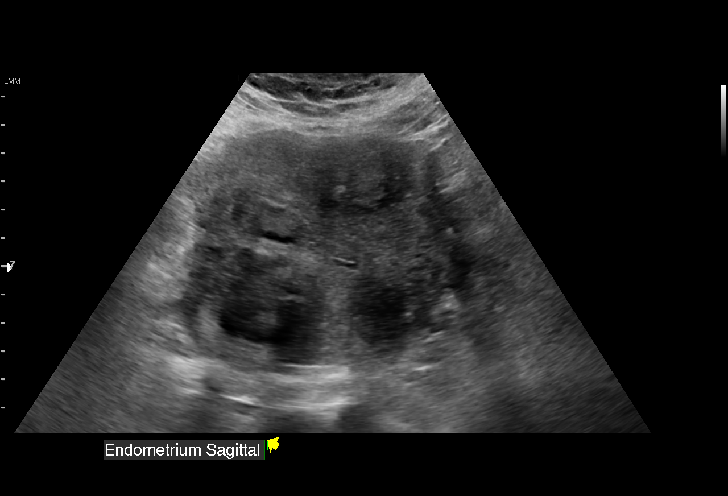
[im 49/79]
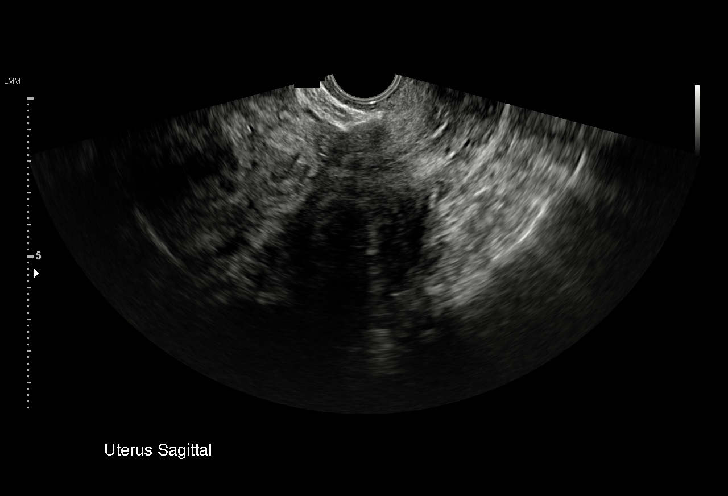
[im 56/79]
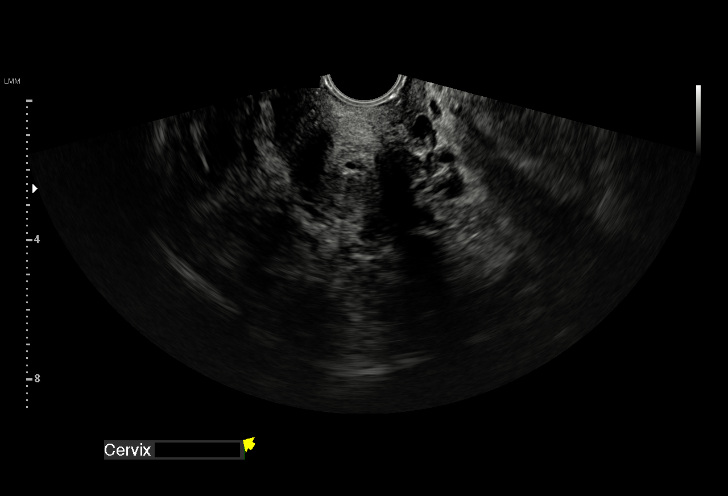
[im 62/79]
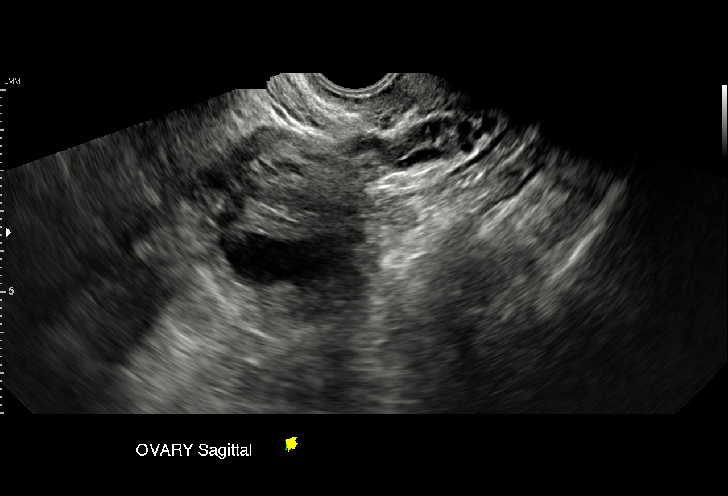
[im 66/79]
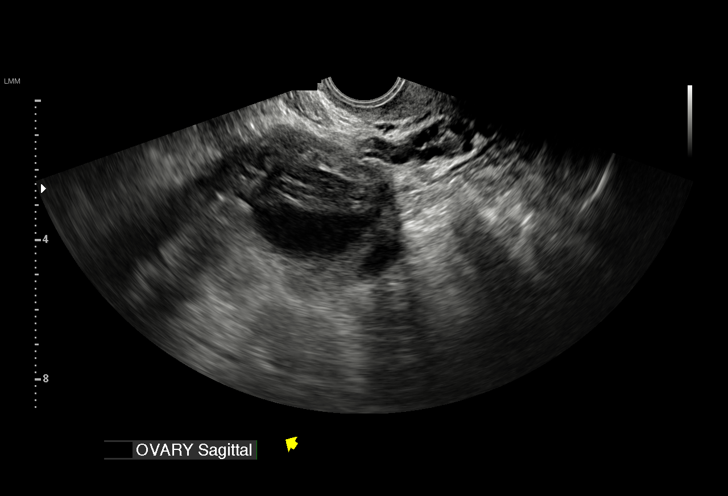
[im 72/79]
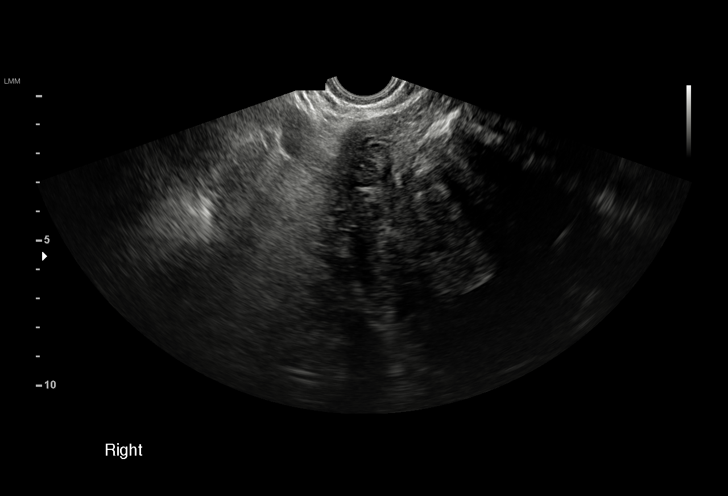
[im 79/79]
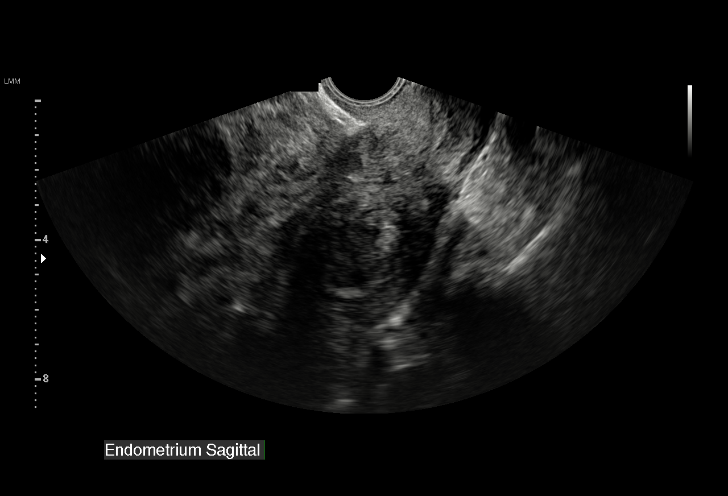

[15 of 25 positions shown; findings below may reference images not displayed]

FINDINGS: Uterus

Measurements: 12.4 x 1.6 x 9.3 cm. Multiple hypoechoic uterine
masses. Three anterior masses measuring 1.5 x 1.2 x 1.1 cm
(submucosal), 3.6 x 3.9 x 4.2 cm (submucosal) and 2.5 x 4 x 3.5 cm
(subserosal) respectively. Posterior uterine mass in the lower
uterine segment measuring 3.5 x 3.2 x 2.7 cm (submucosal). Masses
are most consistent with multiple fibroids.

Endometrium

Thickness: Tubercle to fully visualize. No focal abnormality
visualized.

Right ovary

Not visualized.

Left ovary

Measurements: 4.1 x 3.6 x 3.3 cm. 3 x 2.1 x 2.2 cm hypoechoic,
avascular left ovarian mass most consistent with a complex cyst.

Pulsed Doppler evaluation of the left ovary demonstrates normal
low-resistance arterial and venous waveforms.

Other findings

No abnormal free fluid.
IMPRESSION: 1. Fibroid uterus.
2. Complex left ovarian cyst which may reflect a hemorrhagic cyst or
endometrioma.
3. No left ovarian torsion.
4. Nonvisualized right ovary.

## 2018-02-13 IMAGING — CT CT ABD-PELV W/ CM
1 of 2 series · 15 of 32 positions shown, 19 images · IV contrast (OMNIPAQUE)
Comparison: Ultrasound 06/21/2016

CLINICAL DATA: Abdominal hysterectomy and salpingectomy. Upper and
mid abdominal pain. Low-grade fever.

EXAM:
CT ABDOMEN AND PELVIS WITH CONTRAST
TECHNIQUE: Multidetector CT imaging of the abdomen and pelvis was performed
using the standard protocol following bolus administration of
intravenous contrast.
CONTRAST:  100mL PAO53Q-1MM IOPAMIDOL (PAO53Q-1MM) INJECTION 61%

[Series 2: routine abdomen/pelvis with · axial · 0.78mm/px · z∈[+642,+1007]mm · 15 of 81 slices shown, 19 images]
[im 4/81  soft-tissue]
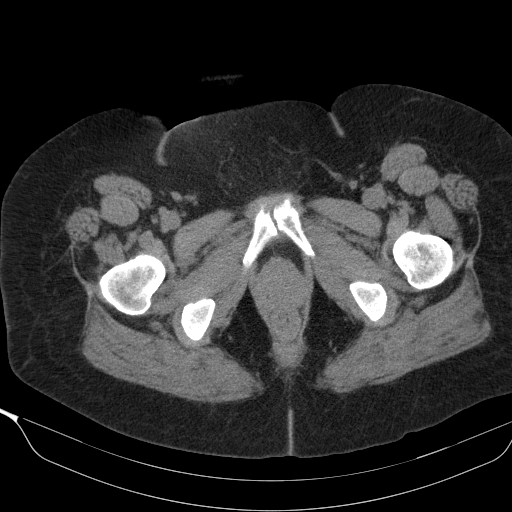
[im 4/81  bone]
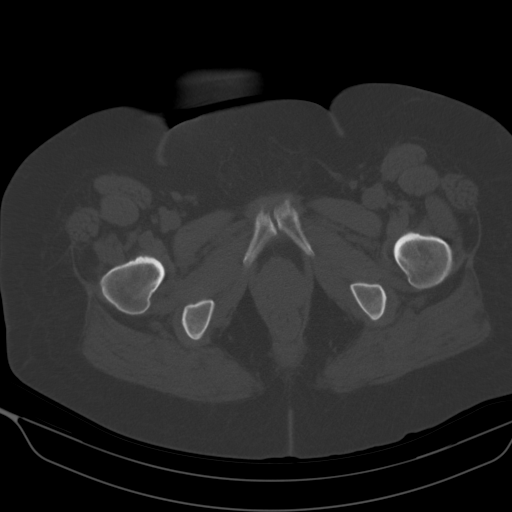
[im 11/81  soft-tissue]
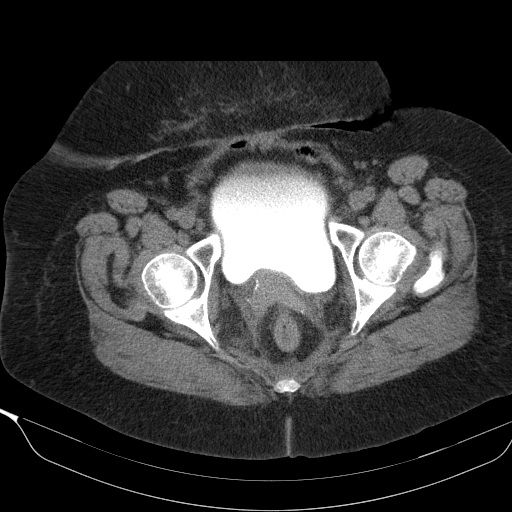
[im 17/81  soft-tissue]
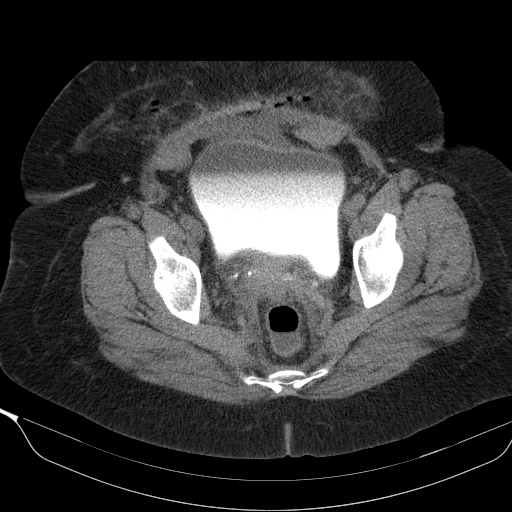
[im 24/81  soft-tissue]
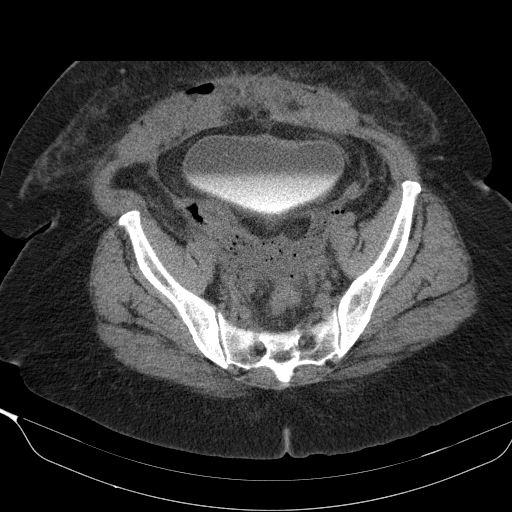
[im 27/81  soft-tissue]
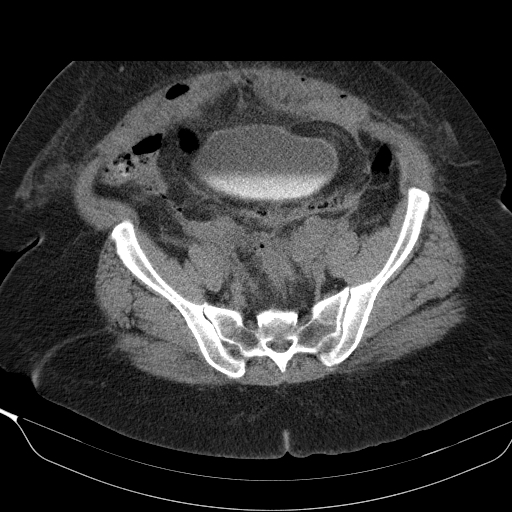
[im 34/81  soft-tissue]
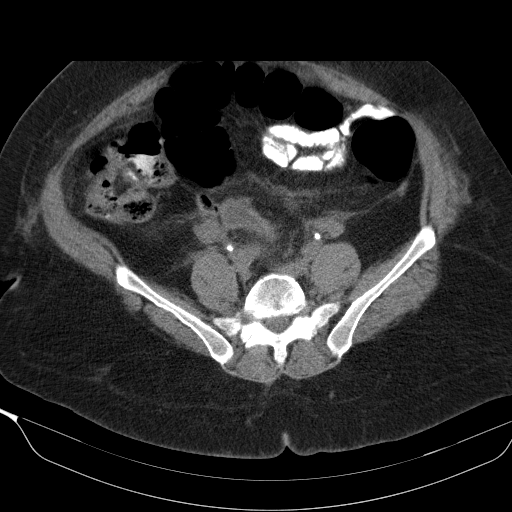
[im 41/81  soft-tissue]
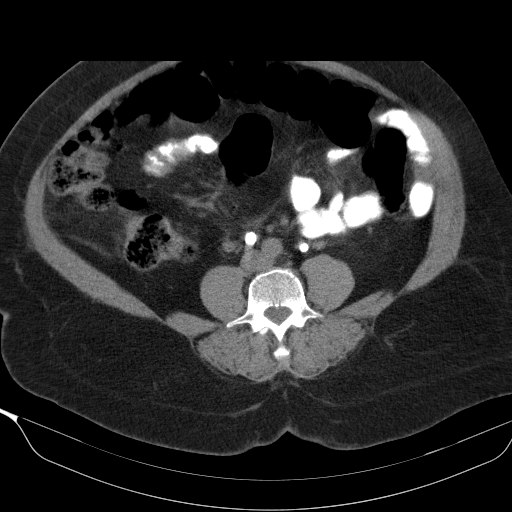
[im 47/81  soft-tissue]
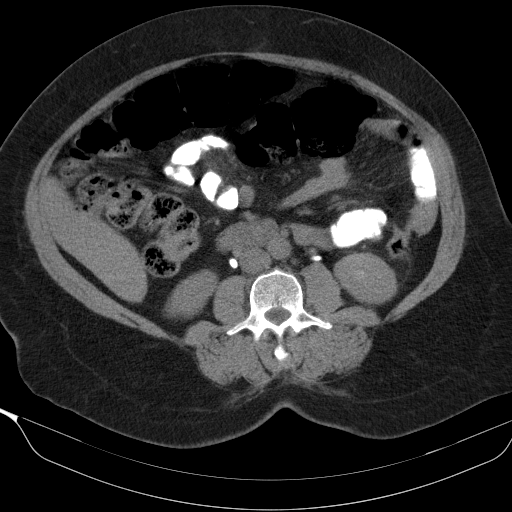
[im 54/81  soft-tissue]
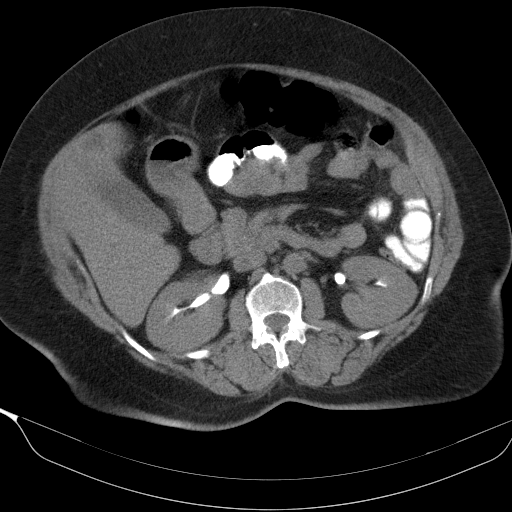
[im 54/81  bone]
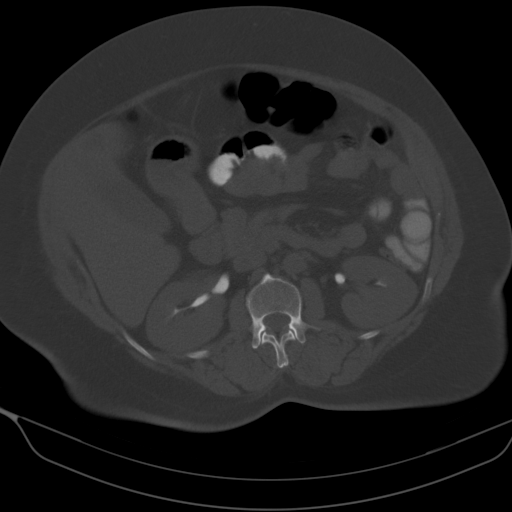
[im 57/81  soft-tissue]
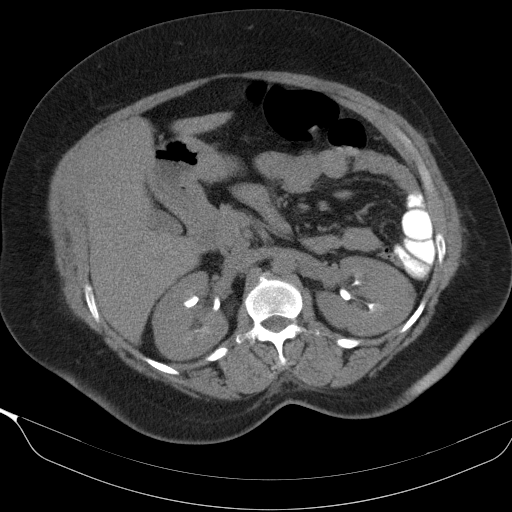
[im 64/81  soft-tissue]
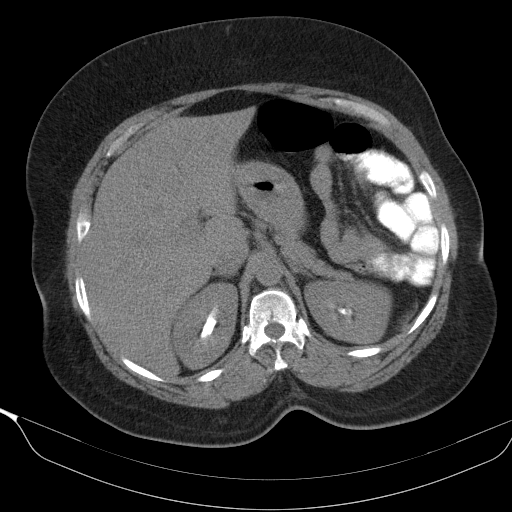
[im 67/81  lung]
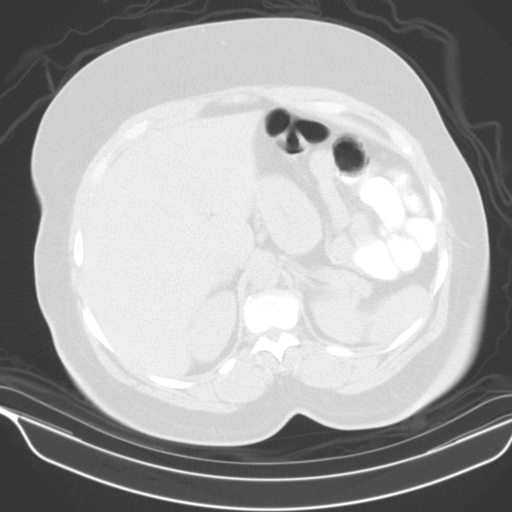
[im 71/81  soft-tissue]
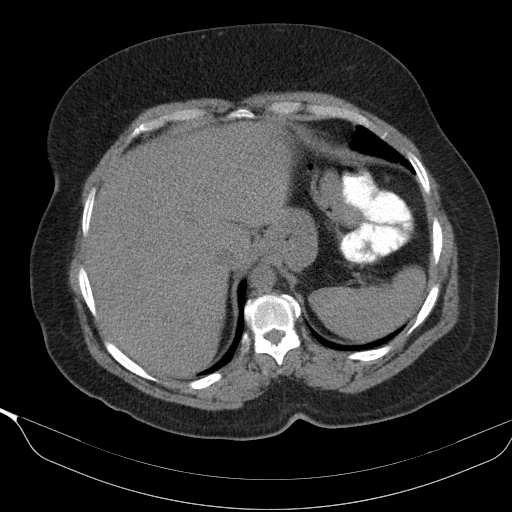
[im 71/81  lung]
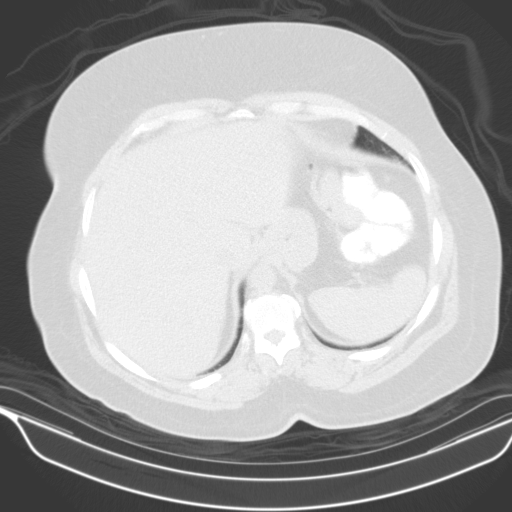
[im 74/81  lung]
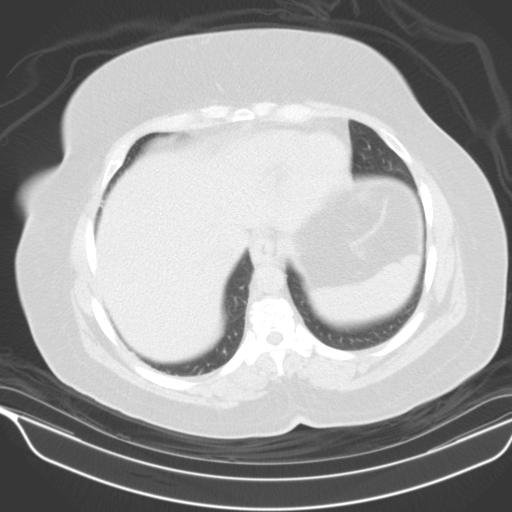
[im 77/81  soft-tissue]
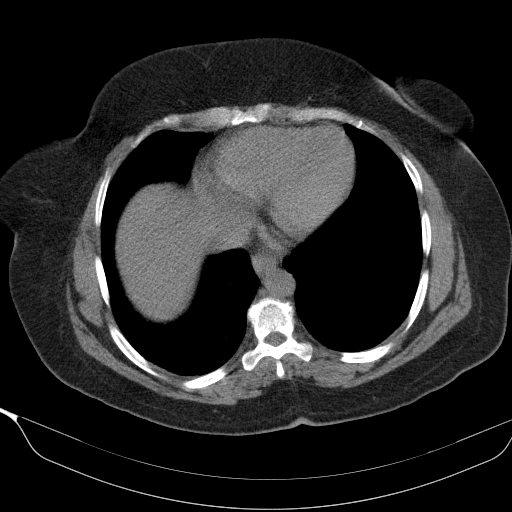
[im 77/81  lung]
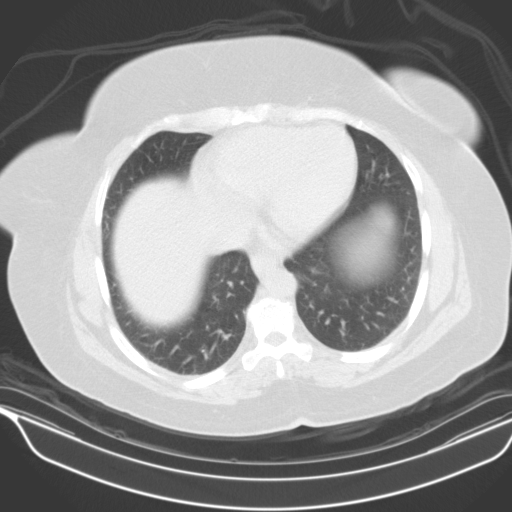

[15 of 32 positions shown; findings below may reference images not displayed]

FINDINGS: Scanning was performed after 50 minutes delay. Initial dose
infiltrated. Unsuccessful attempts to restart IV. IV contrast is
excreted in the kidneys and collects in the bladder.

Lower chest: Lung bases are clear.

Hepatobiliary: No focal hepatic lesion. No biliary duct dilatation.
Gallbladder is normal. Common bile duct is normal.

Pancreas: Pancreas is normal. No ductal dilatation. No pancreatic
inflammation.

Spleen: Normal spleen

Adrenals/urinary tract: Adrenal glands normal. Delayed imaging
demonstrates intact ureters. Contrast collects in the bladder
without complication

Stomach/Bowel: Stomach, small-bowel cecum normal. Appendix not
identified. Minimal stool throughout the colon. No evidence bowel
obstruction or ileus.

Vascular/Lymphatic: Abdominal aorta is normal caliber. There is no
retroperitoneal or periportal lymphadenopathy. No pelvic
lymphadenopathy.

Reproductive: Post hysterectomy. There is scattered foci of gas
along the vaginal cuff and within the broad ligaments. Small amount
of gas along the ventral peritoneal surface along the rectus
musculature.

Other: No evidence of abscess formation. Small amount gas fluid or
vaginal cuff following surgery.

Musculoskeletal: No aggressive osseous lesion.
IMPRESSION: 1. Expected postoperative findings of moderate volume fluid and gas
along the vaginal cuff and in the broad ligaments. Small foci of gas
along the rectus muscles.
2. No abscess or organized fluid collection.
3. No evidence of ileus or bowel obstruction.
4. Normal ureters an dbladder.
